# Patient Record
Sex: Female | Born: 1975 | Race: Black or African American | Hispanic: No | Marital: Single | Smoking: Current every day smoker
Health system: Southern US, Community
[De-identification: ages and names within clinical notes are randomized; demographics above are authoritative.]

## PROBLEM LIST (undated history)

## (undated) DIAGNOSIS — F419 Anxiety disorder, unspecified: Secondary | ICD-10-CM

## (undated) DIAGNOSIS — R51 Headache: Secondary | ICD-10-CM

## (undated) DIAGNOSIS — J45909 Unspecified asthma, uncomplicated: Secondary | ICD-10-CM

## (undated) DIAGNOSIS — F32A Depression, unspecified: Secondary | ICD-10-CM

## (undated) DIAGNOSIS — F329 Major depressive disorder, single episode, unspecified: Secondary | ICD-10-CM

---

## 2011-06-27 ENCOUNTER — Encounter (HOSPITAL_COMMUNITY): Payer: Self-pay | Admitting: *Deleted

## 2011-06-27 ENCOUNTER — Inpatient Hospital Stay (HOSPITAL_COMMUNITY)
Admission: RE | Admit: 2011-06-27 | Discharge: 2011-06-29 | DRG: 885 | Disposition: A | Discharge status: Home / Self Care | Payer: Medicaid Other | Attending: Psychiatry | Admitting: Psychiatry

## 2011-06-27 DIAGNOSIS — F339 Major depressive disorder, recurrent, unspecified: Principal | ICD-10-CM

## 2011-06-27 DIAGNOSIS — R45851 Suicidal ideations: Secondary | ICD-10-CM

## 2011-06-27 DIAGNOSIS — F4001 Agoraphobia with panic disorder: Secondary | ICD-10-CM

## 2011-06-27 DIAGNOSIS — F329 Major depressive disorder, single episode, unspecified: Secondary | ICD-10-CM

## 2011-06-27 HISTORY — DX: Depression, unspecified: F32.A

## 2011-06-27 HISTORY — DX: Major depressive disorder, single episode, unspecified: F32.9

## 2011-06-27 HISTORY — DX: Anxiety disorder, unspecified: F41.9

## 2011-06-27 HISTORY — DX: Headache: R51

## 2011-06-27 LAB — DIFFERENTIAL
Basophils Absolute: 0 10*3/uL (ref 0.0–0.1)
Basophils Relative: 0 % (ref 0–1)
Eosinophils Absolute: 0.2 10*3/uL (ref 0.0–0.7)
Monocytes Relative: 6 % (ref 3–12)
Neutro Abs: 6.2 10*3/uL (ref 1.7–7.7)
Neutrophils Relative %: 61 % (ref 43–77)

## 2011-06-27 LAB — COMPREHENSIVE METABOLIC PANEL
AST: 14 U/L (ref 0–37)
Albumin: 3.8 g/dL (ref 3.5–5.2)
Alkaline Phosphatase: 59 U/L (ref 39–117)
BUN: 9 mg/dL (ref 6–23)
Chloride: 101 mEq/L (ref 96–112)
Potassium: 3.7 mEq/L (ref 3.5–5.1)
Sodium: 135 mEq/L (ref 135–145)
Total Bilirubin: 0.2 mg/dL — ABNORMAL LOW (ref 0.3–1.2)
Total Protein: 7.8 g/dL (ref 6.0–8.3)

## 2011-06-27 LAB — CBC
MCH: 25.9 pg — ABNORMAL LOW (ref 26.0–34.0)
MCHC: 32.1 g/dL (ref 30.0–36.0)
Platelets: 311 10*3/uL (ref 150–400)
RDW: 13.8 % (ref 11.5–15.5)

## 2011-06-27 MED ORDER — TRAZODONE HCL 100 MG PO TABS
100.0000 mg | ORAL_TABLET | Freq: Every evening | ORAL | Status: DC | PRN
  Administered 2011-06-27 – 2011-06-28 (×2): 100 mg via ORAL
  Filled 2011-06-27 (×3): qty 1

## 2011-06-27 MED ORDER — MAGNESIUM HYDROXIDE 400 MG/5ML PO SUSP: 30.0000 mL | Freq: Every day | ORAL | Status: DC | PRN

## 2011-06-27 MED ORDER — ACETAMINOPHEN 325 MG PO TABS: 650.0000 mg | ORAL_TABLET | Freq: Four times a day (QID) | ORAL | Status: DC | PRN

## 2011-06-27 MED ORDER — ALBUTEROL SULFATE (5 MG/ML) 0.5% IN NEBU
2.5000 mg | INHALATION_SOLUTION | Freq: Four times a day (QID) | RESPIRATORY_TRACT | Status: DC | PRN
  Administered 2011-06-27: 2.5 mg via RESPIRATORY_TRACT

## 2011-06-27 MED ORDER — MONTELUKAST SODIUM 10 MG PO TABS
10.0000 mg | ORAL_TABLET | Freq: Every day | ORAL | Status: DC
  Administered 2011-06-27 – 2011-06-28 (×2): 10 mg via ORAL
  Filled 2011-06-27 (×3): qty 1

## 2011-06-27 MED ORDER — FLUTICASONE PROPIONATE 50 MCG/ACT NA SUSP
2.0000 | Freq: Two times a day (BID) | NASAL | Status: DC | PRN
  Administered 2011-06-28 – 2011-06-29 (×2): 2 via NASAL
  Filled 2011-06-27: qty 16

## 2011-06-27 MED ORDER — ALUM & MAG HYDROXIDE-SIMETH 200-200-20 MG/5ML PO SUSP: 30.0000 mL | ORAL | Status: DC | PRN

## 2011-06-27 NOTE — Progress Notes (Signed)
  Admit Note: Patient voluntarily admitted to bed 502-2. Patient was directed to [facility] after initial meeting with outpatient therapist. Patient admits to feelings of hopelessness, anhedonia and anxiety with passive SI. Patient states that she has Bipolar and self medicates with THC daily. Currently not on psych medications. Reports using inhalers for asthma. Patient  Reports no PCP. Patient requesting help with medications and coping skills.
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
## 2011-06-28 DIAGNOSIS — F329 Major depressive disorder, single episode, unspecified: Secondary | ICD-10-CM | POA: Diagnosis present

## 2011-06-28 DIAGNOSIS — F339 Major depressive disorder, recurrent, unspecified: Secondary | ICD-10-CM

## 2011-06-28 LAB — CBC
Hemoglobin: 12.1 g/dL (ref 12.0–15.0)
MCV: 81 fL (ref 78.0–100.0)
Platelets: 307 10*3/uL (ref 150–400)
RBC: 4.63 MIL/uL (ref 3.87–5.11)
WBC: 10.5 10*3/uL (ref 4.0–10.5)

## 2011-06-28 LAB — COMPREHENSIVE METABOLIC PANEL
ALT: 15 U/L (ref 0–35)
Alkaline Phosphatase: 63 U/L (ref 39–117)
BUN: 11 mg/dL (ref 6–23)
CO2: 26 mEq/L (ref 19–32)
GFR calc Af Amer: >90 mL/min (ref >90)
GFR calc non Af Amer: >90 mL/min (ref >90)
Glucose, Bld: 102 mg/dL — ABNORMAL HIGH (ref 70–99)
Potassium: 4.1 mEq/L (ref 3.5–5.1)
Sodium: 138 mEq/L (ref 135–145)
Total Bilirubin: 0.2 mg/dL — ABNORMAL LOW (ref 0.3–1.2)

## 2011-06-28 LAB — T4, FREE: Free T4: 1.43 ng/dL (ref 0.80–1.80)

## 2011-06-28 LAB — DIFFERENTIAL
Eosinophils Relative: 2 % (ref 0–5)
Lymphocytes Relative: 27 % (ref 12–46)
Lymphs Abs: 2.9 10*3/uL (ref 0.7–4.0)
Monocytes Relative: 6 % (ref 3–12)
Neutrophils Relative %: 66 % (ref 43–77)

## 2011-06-28 LAB — URINE MICROSCOPIC-ADD ON

## 2011-06-28 LAB — URINALYSIS, ROUTINE W REFLEX MICROSCOPIC
Bilirubin Urine: NEGATIVE
Hgb urine dipstick: NEGATIVE
Ketones, ur: NEGATIVE mg/dL
Protein, ur: NEGATIVE mg/dL
Urobilinogen, UA: 0.2 mg/dL (ref 0.0–1.0)

## 2011-06-28 LAB — T3, FREE: T3, Free: 3.1 pg/mL (ref 2.3–4.2)

## 2011-06-28 LAB — TSH: TSH: 1.143 u[IU]/mL (ref 0.350–4.500)

## 2011-06-28 MED ORDER — ALBUTEROL SULFATE HFA 108 (90 BASE) MCG/ACT IN AERS
2.0000 | INHALATION_SPRAY | RESPIRATORY_TRACT | Status: DC | PRN
  Administered 2011-06-28 (×2): 2 via RESPIRATORY_TRACT
  Filled 2011-06-28 (×2): qty 6.7

## 2011-06-28 MED ORDER — SERTRALINE HCL 50 MG PO TABS
50.0000 mg | ORAL_TABLET | Freq: Every day | ORAL | Status: DC
  Administered 2011-06-28 – 2011-06-29 (×2): 50 mg via ORAL
  Filled 2011-06-28: qty 7
  Filled 2011-06-28 (×3): qty 1

## 2011-06-28 MED ORDER — FLUTICASONE PROPIONATE HFA 220 MCG/ACT IN AERO
2.0000 | INHALATION_SPRAY | Freq: Two times a day (BID) | RESPIRATORY_TRACT | Status: DC
  Administered 2011-06-28 – 2011-06-29 (×2): 2 via RESPIRATORY_TRACT
  Filled 2011-06-28: qty 12

## 2011-06-28 NOTE — Progress Notes (Signed)
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  Panic Disorder with Agoraphobia.  The patient was seen today and reports the following:   ADL's: Intact  Sleep: Sleeping well without difficulty.  Appetite: Yes, AEB: Good Appetite.   Mild>(1-10) >Severe  Hopelessness (1-10): 5  Depression (1-10): 6  Anxiety (1-10): 3   Suicidal Ideation: Adamantly denies any suicidal ideations.  Plan: No  Intent: No  Means: No  Homicidal Ideation: Adamantly denies any Homicidal Ideations.  Plan: No  Intent: No  Means: No  Mental Status: AO x 3  General Appearance /Behavior: Neat and Casual  Eye Contact: Good  Motor Behavior: Normal  Speech: Normal  Level of Consciousness: Alert  Mood: Euthymic  Affect: Bright and Full  Anxiety Mild.  Thought Process: Coherent  Thought Content: WNL with no auditory or visual hallucinations or delusional thinking.  Perception: Normal  Judgment: Fair  Insight: Good.  Cognition: Orientation time, place and person   Treatment Plan Summary:  1. Daily contact with patient to assess and evaluate symptoms and progress in treatment  2. Medication management  3. The patient will deny suicidal ideations for 48 hours prior to discharge and have a depression and anxiety rating of 3 or less.   Plan:  1. Continue current medications.  2. Will start Zoloft at 50 mgs po qam for reported depression and panic symptoms.  3. Continue to monitor. 4. Possible  discharge tomorrow.  SUICIDE RISK:   Minimal: No identifiable suicidal ideation.  Patients presenting with no risk factors but with morbid ruminations; may be classified as minimal risk based on the severity of the depressive symptoms   Kara Hunter 06/28/2011, 2:29 PM

## 2011-06-28 NOTE — Progress Notes (Signed)
Pt wants to get home to her daughter.  She wants help with her depression while she is she and is looking forward to starting antidepressant zoloft today.   She rates her depression a 7  And hoplessness.  7.  She is attending groups, interacting with others and patiently waiting for he meds.

## 2011-06-28 NOTE — Progress Notes (Signed)
[facility] Adult Inpatient Family/Significant Other Suicide Prevention Education  Suicide Prevention Education:  Education Completed; Derrek Monaco, cousin,  (name of family member/significant other) has been identified by the patient as the family member/significant other with whom the patient will be residing, and identified as the person(s) who will aid the patient in the event of a mental health crisis (suicidal ideations/suicide attempt).  With written consent from the patient, the family member/significant other has been provided the following suicide prevention education, prior to the and/or following the discharge of the patient.  The suicide prevention education provided includes the following:  Suicide risk factors  Suicide prevention and interventions  National Suicide Hotline telephone number  [facility] assessment telephone number  [facility] Emergency Assistance 911  [facility] and/or Residential Mobile Crisis Unit telephone number  Request made of family/significant other to:  Remove weapons (e.g., guns, rifles, knives), all items previously/currently identified as safety concern.    Remove drugs/medications (over-the-counter, prescriptions, illicit drugs), all items previously/currently identified as a safety concern.  The family member/significant other verbalizes understanding of the suicide prevention education information provided.  The family member/significant other agrees to remove the items of safety concern listed above.  * Mykaela's cousin does not believe that she is a danger to herself or others if discharged. She reports that Darnetta does not have any access to weapons and that she will be home with [place] watching after her. She verbalized understanding of suicide prevention information and had no questions.   [name] 06/28/2011, 3:12 PM

## 2011-06-28 NOTE — Progress Notes (Signed)
Pt reported high levels of anxiety and depression both at a 7. Her Si was 0. Pt sees a therapist at [facility] (just started when she was sent over here). Pt reports getting her diagnosis from [facility] where she attends groups for SA and other things. Pt is currently living with her cousin and had been since July and does have access to transportation.  She does utilize [employer]. Pt has recently applied for disability citing her asthma and bipolar disorder. Pt was given suicide prevention and safety planning information. Pt was able to come up with ways to help herself stay out of the depressed state and cited doing more things with her daughter. Jeannette How 06/28/2011 11:40 AM

## 2011-06-28 NOTE — Progress Notes (Signed)
Recreation Therapy Group Note  Date: 06/28/2011         Time: 1415      Group Topic/Focus: The focus of this group is on enhancing patients' ability to work cooperatively with others. Groups discusses barriers to cooperation and strategies for successful cooperation.   Participation Level: Minimal  Participation Quality: Resistant  Affect: Irritable  Cognitive: Oriented   Additional Comments: Patient abrasive, disrespectful to staff, complaining about group. Patient became verbally aggressive towards a female peer when he made a suggestion during the intervention, walked in and out of the room several times when she became frustrated.   Kara Hunter 06/28/2011 3:55 PM

## 2011-06-28 NOTE — Tx Team (Signed)
Interdisciplinary Treatment Plan Update (Adult)  Date:  06/29/11 Time Reviewed:  9:30 am  Progress in Treatment: Attending groups: Yes. Participating in groups:  Yes. Taking medication as prescribed:  Yes. Tolerating medication:  Yes. Family/Significant othe contact made:  No, will contact:  contact made with cousin Patient understands diagnosis:  Yes. Discussing patient identified problems/goals with staff:  Yes. Medical problems stabilized or resolved:  Yes. Denies suicidal/homicidal ideation: Yes. Issues/concerns per patient self-inventory:  No. Other:  New problem(s) identified: No, Describe:  none identified  Reason for Continuation of Hospitalization: Pt stable for d/c today  Interventions implemented related to continuation of hospitalization:   Additional comments:  Estimated length of stay:  D/C today Discharge Plan: pt will f/[facility] and therapy  New goal(s):  Review of initial/current patient goals per problem list:   1.  Goal(s): Depression  Met:  Yes  Target date: by d/c  As evidenced by: pt reduced depression from a 10 to a 3.   2.  Goal (s): Suicidal Ideation  Met:  Yes  Target date: by d/c  As evidenced by: pt denies SI.   3.  Goal(s):  Met:  No  Target date:  As evidenced by:  4.  Goal(s):  Met:  No  Target date:  As evidenced by:  Attendees: Patient:     Family:     Physician:  Franchot Gallo, MD 06/29/11 10:30 am  Nursing:   Neill Loft, RN 06/29/11 10:30 am  Case Manager:  Reyes Ivan, LCSW 06/29/11 10:30 am  Counselor:  Angus Palms, LCSW 06/29/11 10:30 am  Other:  Jeannette How, LCSW 06/29/11 10:30 am  Other:  Harl Favor, intern 06/29/11 10:30 am  Other:  Cato Mulligan, RN 06/29/11 10:30 am  Other:      Scribe for Treatment Team:   Carmina Miller

## 2011-06-28 NOTE — Progress Notes (Signed)
  Pt stated that today was her first appt with her therapist. Stated that the therapist asked her if she was depressed or having thoughts of harming herself. Stated she replied "yes". Therapist had to her come to [facility], without letting her go home. "I have a baby at home", referring to her 61yr child. Pt stated she's glad she(pt) lives with her cousin. Stated at least her child will be taken care of. Support and encouragement were offered.

## 2011-06-28 NOTE — Progress Notes (Signed)
[facility] Group Notes:  (Counselor/Nursing/MHT/Case Management/Adjunct)  06/28/2011 1:28 PM  Type of Therapy:  Group Therapy  Participation Level:  None  Participation Quality:  Inattentive and Drowsy  Affect:  Flat  Cognitive:  Asleep  Insight:  None  Engagement in Group:  None  Engagement in Therapy:  None  Modes of Intervention:  Support and Exploration  Summary of Progress/Problems: Brodi slept through group. She was awakened several times and counselor attempted to engage her, but she kept falling back asleep, then laughing about it when she was awakened.    [name] 06/28/2011, 1:28 PM

## 2011-06-28 NOTE — Progress Notes (Signed)
  Job number for dictation is [id number]. This is Landry Corporal, np dicatating a psychiatric admission note. Thank you

## 2011-06-28 NOTE — Plan of Care (Signed)
Problem: Alteration in mood Goal: LTG-Patient reports reduction in suicidal thoughts (Patient reports reduction in suicidal thoughts and is able to verbalize a safety plan for whenever patient is feeling suicidal)  Outcome: Not Progressing This is pt's first day Goal: LTG-Pt's behavior demonstrates decreased signs of depression (Patient's behavior demonstrates decreased signs of depression to the point the patient is safe to return home and continue treatment in an outpatient setting)  Outcome: Progressing This is the pt's first day. Goal: STG-Patient is compliant with medication regimen Outcome: Progressing Pay was compliant in taking hs meds Goal: STG-Patient reports thoughts of self-harm to staff Outcome: Not Applicable Date Met:  06/28/11 Denies at this time  Problem: Consults Goal: Suicide Risk Patient Education (See Patient Education module for education specifics)  Outcome: Progressing Pt's first day  Problem: Diagnosis: Increased Risk For Suicide Attempt Goal: LTG-Patient Will Show Positive Response to Medication LTG (by discharge) : Patient will show positive response to medication and will participate in the development of the discharge plan.  Outcome: Progressing Pt took prn meds Goal: LTG-Patient Will Report Improved Mood and Deny Suicidal LTG (by discharge) Patient will report improved mood and deny suicidal ideation.  Outcome: Progressing Initiated for pt's first day  Problem: Spiritual Needs Goal: Ability to function at adequate level Outcome: Progressing Initiated

## 2011-06-29 LAB — TSH: TSH: 0.863 u[IU]/mL (ref 0.350–4.500)

## 2011-06-29 MED ORDER — SERTRALINE HCL 50 MG PO TABS: 50.0000 mg | ORAL_TABLET | Freq: Every day | ORAL | Status: AC

## 2011-06-29 MED ORDER — TRAZODONE HCL 100 MG PO TABS: 100.0000 mg | ORAL_TABLET | Freq: Every evening | ORAL | Status: AC | PRN

## 2011-06-29 MED ORDER — INFLUENZA VIRUS VACC SPLIT PF IM SUSP
0.5000 mL | INTRAMUSCULAR | Status: AC
  Administered 2011-06-29: 0.5 mL via INTRAMUSCULAR

## 2011-06-29 NOTE — Plan of Care (Signed)
Problem: Alteration in mood Goal: STG-Patient is able to discuss feelings and issues Process the role of emotions and logic in Kara Hunter's experience of depression. Outcome: Adequate for Discharge Kara Hunter reports that she is an emotional person, and that she can use logic when she needs to make decisions. However, she was unwilling or unable to further examine ways to use wisemind techniques to combat depression.

## 2011-06-29 NOTE — Discharge Summary (Signed)
NAME:  DIA, DONATE NO.:  [id number]  MEDICAL RECORD NO.:  [id number]  LOCATION:  0502                          FACILITY:  [facility]  PHYSICIAN:  Franchot Gallo, MD     DATE OF BIRTH:  1975-11-22  DATE OF ADMISSION:  06/27/2011 DATE OF DISCHARGE:  06/29/2011                              DISCHARGE SUMMARY   REASON FOR ADMISSION:  The patient was here for assessment after she had seen her therapist for the first time and the patient was reporting some suicidal thinking.  The patient denied any suicide attempt and did have a deterrent for self-harm, which was her children.  PAST PSYCHIATRIC HISTORY:  First admission to [facility]. Currently in outpatient therapy.  Has never been on any psychotropic medications.  PERTINENT LABORATORIES:  Her CMP was within normal limits.  Her TSH was 0.863.  Urinalysis showed 3-6 WBCs.  Blood pressure was 117/76.  SIGNIFICANT FINDINGS:  The patient was admitted to the adult milieu for safety and stabilization.  We started the patient on Zoloft.  She was having no medication side effects.  She was denying any suicidal or homicidal thoughts.  She slept well on her medications.  Appetite was good.  Denied any psychotic symptoms.  On the day of discharge, the patient was fully alert and cooperative.  Sleep was good.  Appetite was good.  Having no suicidal or homicidal thoughts or psychotic symptoms. She was having no medication side effects to her Zoloft or trazodone and was considered stable for discharge.  Her followup appointment was with [facility] on July 04, 2011.  Her discharge medications were Zoloft 50 mg 1 daily, trazodone 100 mg q.h.s. for sleep.  The patient was to continue taking her albuterol treatments, her Flonase nasal spray as needed, Singulair 10 mg daily, and triamcinolone cream topically t.i.d. as needed for eczema.     Landry Corporal, N.P.   ______________________________ Franchot Gallo, MD    JO/MEDQ  D:  06/29/2011  T:  06/29/2011  Job:  [id number]

## 2011-06-29 NOTE — Progress Notes (Signed)
[name] attended groups on using wisemind to balance depressive thoughts and logical thoughts, but did not participate fully. She was able to discuss ways that she uses emotion and reason mind, but not able/willing to talk about how she can move toward wisemind when feeling depressed. Kara Hunter is aware of factors that place her at a heightened suicide risk, and suicide prevention education was done with her cousin, Karel Jarvis, who had no safety concerns.

## 2011-06-29 NOTE — Assessment & Plan Note (Signed)
NAME:  Kara Hunter, Kara Hunter NO.:  [id number]  MEDICAL RECORD NO.:  [id number]  LOCATION:  0502                          FACILITY:  [facility]  PHYSICIAN:  Franchot Gallo, MD     DATE OF BIRTH:  1975/12/21  DATE OF ADMISSION:  06/27/2011 DATE OF DISCHARGE:                      PSYCHIATRIC ADMISSION ASSESSMENT   HISTORY OF PRESENT ILLNESS:  Patient is here after she was sent by her therapist for expressing some suicidal thoughts.  This is her first visit with this therapist, the patient was reporting her symptoms of depression, having a suicidal plan to cut her wrist but stating that she would never harm herself, reporting her to deterrent from self-harm would be her children.  Again she was encouraged to come to Behavior Health for further assessment of her suicidal thinking.  The patient does report feeling depressed for the past several weeks, has never been on any medications.  She denies any substance use.  Denies any psychotic symptoms.  Denies any specific stressors.  The patient was reporting problems with sleep for the past 6-7 months and recently has had an 8 pounds weight loss.  PAST PSYCHIATRIC HISTORY:  First admission to [facility], has never been on any antidepressants, has never had any other hospitalizations.  SOCIAL HISTORY:  This is a 35 year old female, single, has 2 children ages 46 and 28, she lives in [place] with a cousin.  She is unemployed, has no legal troubles.  MEDICAL PROBLEMS:  History of asthma is currently on inhalers and Singulair.  PRIMARY CARE PROVIDERS:  None.  MEDICATIONS:  The patient again lists Singulair 10 mg daily, Flonase nasal spray and albuterol inhaler.  DRUG ALLERGIES: PENICILLIN, problems with anaphylaxis.  REVIEW OF SYSTEMS:  GENERAL:  The patient reports problems with insomnia, again problems with appetite changes with an 8 pounds weight loss.  CARDIORESPIRATORY:  Denies any chest pain, does get short  of breath with her asthma attacks.  GASTROINTESTINAL:  No problems with nausea, vomiting, diarrhea or constipation.  No dysuria.  History of dry skin.  No other lacerations or rashes.  No dysuria, hematuria.  No muscle pains.  No headaches, seizures and positive for depression with suicidal thinking.  LABORATORY DATA:  Shows a TSH of 1.143, her CMP is within normal limits.  MENTAL STATUS EXAM:  Patient is fully alert and cooperative.  She is dressed in her own clothing.  Good eye contact, very bubbly.  She has a good sense of humor.  Thought processes are coherent, goal directed.  No evidence of any psychotic symptoms.  Denies any suicidal or homicidal thoughts.  Judgment and insight appear to be good.  Her memory is intact.  DIAGNOSES:  Axis I:  Major depressive disorder. Axis II:  Deferred. Axis III:  History of asthma and eczema. Axis IV:  Deferred at this time. Axis V:  Current is 45.  PLAN:  Is to initiate Zoloft for her depressive symptoms.  Patient is to continue with her therapy.  We will contact her support group for safety issues.  Her tentative length of stay is 2-3 days.     Landry Corporal, N.P.   ______________________________ Franchot Gallo, MD    JO/MEDQ  D:  06/28/2011  T:  06/29/2011  Job:  [id number]

## 2011-06-29 NOTE — Discharge Summary (Addendum)
  This is job number for a discharge summary [id number]. This is Landry Corporal, NP dictating this note for Dr. Allena Katz. Thank you

## 2011-06-29 NOTE — Discharge Summary (Signed)
Pt attended discharge planning group and actively participated.  Pt presents with bright affect and mood.  Pt reports feeling stable to d/c today.  Pt ranks depression and anxiety at a 3 and denies SI.  Pt will follow up with [facility] for medication management and therapy; appointment scheduled by SW.  Pt will return home with children and will utilize [facility] for transportation back home.  SW will arrange this.  Met pt's goals by ensuring pt has follow up and discussing safety planning and suicide prevention with pt.  Pt was able to identify her warning signs and who to call if feeling depressed and/or suicidal again.  No further d/c needs voiced.    Reyes Ivan, LCSWA 06/29/2011  9:23 AM

## 2011-06-29 NOTE — Progress Notes (Signed)
  2222 Pt was in the hallway at the time of this assessment.  Pt was appropriate during assessment and showed humor as well. Pt denies any SI/HI,AVH,or pain. Pt denies any feelings of anxiety or depression, but was given Trazodone 100 mg for insomnia. Support was given, Q15 checks continue, pt remains safe on the unit.

## 2011-06-29 NOTE — Progress Notes (Signed)
  Discharge Note:  Pt was discharged ambulatory to ride home with [facility].  She denies SI/HI  She verbalizes understanding of discharge meds and followup.  She was given a suicide prevention pamphlet and she is eager to see her daughter.  She says she plans to try to stop smoking .

## 2011-06-29 NOTE — Progress Notes (Signed)
[facility] Group Notes:  (Counselor/Nursing/MHT/Case Management/Adjunct)  06/29/2011 1:52 PM  Type of Therapy:  Psychoeducational Skills  Participation Level:  None  Participation Quality:  Drowsy  Affect:  Labile  Cognitive: ?  Insight:  None  Engagement in Group:  None  Engagement in Therapy:  None  Modes of Intervention:  Education  Summary of Progress/Problems: Kara Hunter appeared to be sleep or drowsy throughout the entire group session. She did sit up periodically to look around but her actual participation was very little to none. She was not open to share her experience related to wise mind the involved components (reason and emotion)  Quincy Sheehan 06/29/2011, 1:52 PM
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
## 2011-06-29 NOTE — Progress Notes (Signed)
Suicide Risk Assessment  Discharge Assessment     Demographic factors:  Assessment Details Time of Assessment: Discharge Information Obtained From: Patient Current Mental Status:  Current Mental Status:  (denies SI) Risk Reduction Factors:  Risk Reduction Factors: Positive social support;Sense of responsibility to family;Responsible for children under 36 years of age;Positive therapeutic relationship  CLINICAL FACTORS:   Severe Anxiety and/or Agitation Panic Attacks Depression:   Anhedonia Insomnia More than one psychiatric diagnosis Previous Psychiatric Diagnoses and Treatments  Diagnosis:  Axis I: Major Depressive Disorder - Recurrent.  Panic Disorder with Agoraphobia.   The patient was seen today and reports the following:   ADL's: Intact  Sleep: Sleeping well without difficulty.  Appetite: Yes, AEB: Good Appetite.  Mild>(1-10) >Severe  Hopelessness (1-10): 0  Depression (1-10): 3-4  Anxiety (1-10): 2-3   Suicidal Ideation: Adamantly denies any suicidal ideations.  Plan: No  Intent: No  Means: No  Homicidal Ideation: Adamantly denies any Homicidal Ideations.  Plan: No  Intent: No  Means: No  Mental Status: AO x 3 General Appearance /Behavior: Neat and Casual  Eye Contact: Good  Motor Behavior: Normal  Speech: Normal  Level of Consciousness: Alert  Mood: Mildly Depressed.  Affect: Bright and Full  Anxiety Mild.  Thought Process: Coherent  Thought Content: WNL with no auditory or visual hallucinations or delusional thinking.  Perception: Normal  Judgment: Good  Insight: Good.  Cognition: Orientation time, place and person   Treatment Plan Summary:  1. Daily contact with patient to assess and evaluate symptoms and progress in treatment  2. Medication management  3. The patient will deny suicidal ideations for 48 hours prior to discharge and have a depression and anxiety rating of 3 or less.  Plan:   1. Continue current medications.  2. The patient will  be discharged today as requested to outpatient follow-up as outlined on her discharge instructions. 3.  Discharge home today.   Minimal: No identifiable suicidal ideation.  Patients presenting with no risk factors but with morbid ruminations; may be classified as minimal risk based on the severity of the depressive symptoms  PLAN OF CARE:  Kara Hunter 06/29/2011, 1:39 PM

## 2011-07-03 NOTE — Progress Notes (Signed)
Patient Discharge Instructions:  D/C instructions faxed, Date faxed:  06/30/11 D/C Summary faxed, Date faxed:  06/30/11 Med. Rec. Form faxed, Date faxed:  06/30/11  Lolita Cram nita, 07/03/2011, 10:52 AM

## 2012-01-22 ENCOUNTER — Emergency Department (HOSPITAL_BASED_OUTPATIENT_CLINIC_OR_DEPARTMENT_OTHER)
Admission: EM | Admit: 2012-01-22 | Discharge: 2012-01-22 | Disposition: A | Discharge status: Home / Self Care | Payer: Medicaid Other | Attending: Emergency Medicine | Admitting: Emergency Medicine

## 2012-01-22 ENCOUNTER — Encounter (HOSPITAL_BASED_OUTPATIENT_CLINIC_OR_DEPARTMENT_OTHER): Payer: Self-pay | Admitting: *Deleted

## 2012-01-22 DIAGNOSIS — H05231 Hemorrhage of right orbit: Secondary | ICD-10-CM

## 2012-01-22 DIAGNOSIS — S0083XA Contusion of other part of head, initial encounter: Secondary | ICD-10-CM

## 2012-01-22 DIAGNOSIS — Y9229 Other specified public building as the place of occurrence of the external cause: Secondary | ICD-10-CM | POA: Insufficient documentation

## 2012-01-22 DIAGNOSIS — S0010XA Contusion of unspecified eyelid and periocular area, initial encounter: Secondary | ICD-10-CM | POA: Insufficient documentation

## 2012-01-22 DIAGNOSIS — Y990 Civilian activity done for income or pay: Secondary | ICD-10-CM | POA: Insufficient documentation

## 2012-01-22 MED ORDER — HYDROCODONE-ACETAMINOPHEN 5-325 MG PO TABS
1.0000 | ORAL_TABLET | Freq: Once | ORAL | Status: AC
  Administered 2012-01-22: 1 via ORAL
  Filled 2012-01-22: qty 1

## 2012-01-22 MED ORDER — HYDROCODONE-ACETAMINOPHEN 5-325 MG PO TABS: ORAL_TABLET | ORAL | Status: AC
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
## 2012-01-22 NOTE — ED Notes (Signed)
Ice pack given for home use- rx x 1 given for hydrocodone- pt has a ride

## 2012-01-22 NOTE — ED Notes (Signed)
Ghim MD at bedside. 
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
## 2016-04-11 ENCOUNTER — Emergency Department (HOSPITAL_COMMUNITY): Payer: Medicaid Other

## 2016-04-11 ENCOUNTER — Emergency Department (HOSPITAL_COMMUNITY): Payer: Medicaid Other | Admitting: Anesthesiology

## 2016-04-11 ENCOUNTER — Encounter (HOSPITAL_COMMUNITY): Admission: EM | Disposition: A | Discharge status: Home Health | Payer: Self-pay | Source: Home / Self Care

## 2016-04-11 ENCOUNTER — Encounter (HOSPITAL_COMMUNITY): Payer: Self-pay | Admitting: Emergency Medicine

## 2016-04-11 ENCOUNTER — Inpatient Hospital Stay (HOSPITAL_COMMUNITY)
Admission: EM | Admit: 2016-04-11 | Discharge: 2016-04-18 | DRG: 329 | Disposition: A | Discharge status: Home Health | Payer: Medicaid Other | Attending: Surgery | Admitting: Surgery

## 2016-04-11 DIAGNOSIS — R0989 Other specified symptoms and signs involving the circulatory and respiratory systems: Secondary | ICD-10-CM

## 2016-04-11 DIAGNOSIS — Z01818 Encounter for other preprocedural examination: Secondary | ICD-10-CM

## 2016-04-11 DIAGNOSIS — D62 Acute posthemorrhagic anemia: Secondary | ICD-10-CM | POA: Diagnosis not present

## 2016-04-11 DIAGNOSIS — W3400XA Accidental discharge from unspecified firearms or gun, initial encounter: Secondary | ICD-10-CM

## 2016-04-11 DIAGNOSIS — Z978 Presence of other specified devices: Secondary | ICD-10-CM

## 2016-04-11 DIAGNOSIS — J9690 Respiratory failure, unspecified, unspecified whether with hypoxia or hypercapnia: Secondary | ICD-10-CM | POA: Diagnosis not present

## 2016-04-11 DIAGNOSIS — S36409A Unspecified injury of unspecified part of small intestine, initial encounter: Secondary | ICD-10-CM | POA: Diagnosis present

## 2016-04-11 DIAGNOSIS — I959 Hypotension, unspecified: Secondary | ICD-10-CM | POA: Diagnosis present

## 2016-04-11 DIAGNOSIS — S31139A Puncture wound of abdominal wall without foreign body, unspecified quadrant without penetration into peritoneal cavity, initial encounter: Secondary | ICD-10-CM

## 2016-04-11 DIAGNOSIS — S36509A Unspecified injury of unspecified part of colon, initial encounter: Secondary | ICD-10-CM | POA: Diagnosis present

## 2016-04-11 DIAGNOSIS — F17210 Nicotine dependence, cigarettes, uncomplicated: Secondary | ICD-10-CM | POA: Diagnosis present

## 2016-04-11 DIAGNOSIS — S36598A Other injury of other part of colon, initial encounter: Secondary | ICD-10-CM | POA: Diagnosis present

## 2016-04-11 DIAGNOSIS — J45909 Unspecified asthma, uncomplicated: Secondary | ICD-10-CM | POA: Diagnosis present

## 2016-04-11 DIAGNOSIS — S31640A Puncture wound with foreign body of abdominal wall, right upper quadrant with penetration into peritoneal cavity, initial encounter: Principal | ICD-10-CM | POA: Diagnosis present

## 2016-04-11 DIAGNOSIS — K659 Peritonitis, unspecified: Secondary | ICD-10-CM | POA: Diagnosis present

## 2016-04-11 HISTORY — PX: BOWEL RESECTION: SHX1257

## 2016-04-11 HISTORY — DX: Unspecified asthma, uncomplicated: J45.909

## 2016-04-11 HISTORY — PX: APPLICATION OF WOUND VAC: SHX5189

## 2016-04-11 HISTORY — PX: LAPAROTOMY: SHX154

## 2016-04-11 LAB — I-STAT CHEM 8, ED
BUN: 11 mg/dL (ref 6–20)
CALCIUM ION: 1.12 mmol/L — AB (ref 1.13–1.30)
CREATININE: 0.9 mg/dL (ref 0.44–1.00)
Chloride: 106 mmol/L (ref 101–111)
GLUCOSE: 179 mg/dL — AB (ref 65–99)
HCT: 37 % (ref 36.0–46.0)
Hemoglobin: 12.6 g/dL (ref 12.0–15.0)
Potassium: 2.9 mmol/L — ABNORMAL LOW (ref 3.5–5.1)
Sodium: 141 mmol/L (ref 135–145)
TCO2: 24 mmol/L (ref 0–100)

## 2016-04-11 LAB — COMPREHENSIVE METABOLIC PANEL
ALT: 27 U/L (ref 14–54)
ANION GAP: 8 (ref 5–15)
AST: 29 U/L (ref 15–41)
Albumin: 3.7 g/dL (ref 3.5–5.0)
Alkaline Phosphatase: 46 U/L (ref 38–126)
BILIRUBIN TOTAL: 0.5 mg/dL (ref 0.3–1.2)
BUN: 11 mg/dL (ref 6–20)
CO2: 20 mmol/L — ABNORMAL LOW (ref 22–32)
Calcium: 8.7 mg/dL — ABNORMAL LOW (ref 8.9–10.3)
Chloride: 108 mmol/L (ref 101–111)
Creatinine, Ser: 0.92 mg/dL (ref 0.44–1.00)
Glucose, Bld: 181 mg/dL — ABNORMAL HIGH (ref 65–99)
POTASSIUM: 2.8 mmol/L — AB (ref 3.5–5.1)
Sodium: 136 mmol/L (ref 135–145)
TOTAL PROTEIN: 6.6 g/dL (ref 6.5–8.1)

## 2016-04-11 LAB — CBC
HEMATOCRIT: 36.8 % (ref 36.0–46.0)
Hemoglobin: 11.5 g/dL — ABNORMAL LOW (ref 12.0–15.0)
MCH: 25.3 pg — ABNORMAL LOW (ref 26.0–34.0)
MCHC: 31.3 g/dL (ref 30.0–36.0)
MCV: 80.9 fL (ref 78.0–100.0)
PLATELETS: 317 10*3/uL (ref 150–400)
RBC: 4.55 MIL/uL (ref 3.87–5.11)
RDW: 13.7 % (ref 11.5–15.5)
WBC: 17.6 10*3/uL — AB (ref 4.0–10.5)

## 2016-04-11 LAB — I-STAT CG4 LACTIC ACID, ED: Lactic Acid, Venous: 2.45 mmol/L (ref 0.5–1.9)

## 2016-04-11 LAB — ABO/RH: ABO/RH(D): B POS

## 2016-04-11 SURGERY — LAPAROTOMY, EXPLORATORY
Anesthesia: General | Site: Abdomen

## 2016-04-11 MED ORDER — CEFAZOLIN SODIUM-DEXTROSE 2-3 GM-% IV SOLR
INTRAVENOUS | Status: DC | PRN
  Administered 2016-04-11: 2 g via INTRAVENOUS

## 2016-04-11 MED ORDER — LIDOCAINE 2% (20 MG/ML) 5 ML SYRINGE
INTRAMUSCULAR | Status: AC
  Filled 2016-04-11: qty 5

## 2016-04-11 MED ORDER — ONDANSETRON HCL 4 MG/2ML IJ SOLN
INTRAMUSCULAR | Status: AC
  Filled 2016-04-11: qty 2

## 2016-04-11 MED ORDER — PROPOFOL 10 MG/ML IV BOLUS
INTRAVENOUS | Status: DC | PRN
  Administered 2016-04-11: 100 mg via INTRAVENOUS

## 2016-04-11 MED ORDER — MIDAZOLAM HCL 5 MG/5ML IJ SOLN
INTRAMUSCULAR | Status: DC | PRN
  Administered 2016-04-11 – 2016-04-12 (×2): 2 mg via INTRAVENOUS

## 2016-04-11 MED ORDER — SUFENTANIL CITRATE 50 MCG/ML IV SOLN
INTRAVENOUS | Status: DC | PRN
  Administered 2016-04-11 – 2016-04-12 (×2): 10 ug via INTRAVENOUS
  Administered 2016-04-12: 20 ug via INTRAVENOUS
  Administered 2016-04-12: 10 ug via INTRAVENOUS

## 2016-04-11 MED ORDER — LIDOCAINE HCL (CARDIAC) 20 MG/ML IV SOLN
INTRAVENOUS | Status: DC | PRN
  Administered 2016-04-11: 100 mg via INTRAVENOUS

## 2016-04-11 MED ORDER — SUCCINYLCHOLINE CHLORIDE 200 MG/10ML IV SOSY
PREFILLED_SYRINGE | INTRAVENOUS | Status: AC
  Filled 2016-04-11: qty 10

## 2016-04-11 MED ORDER — SUGAMMADEX SODIUM 200 MG/2ML IV SOLN
INTRAVENOUS | Status: AC
  Filled 2016-04-11: qty 2

## 2016-04-11 MED ORDER — PHENYLEPHRINE 40 MCG/ML (10ML) SYRINGE FOR IV PUSH (FOR BLOOD PRESSURE SUPPORT)
PREFILLED_SYRINGE | INTRAVENOUS | Status: AC
  Filled 2016-04-11: qty 10

## 2016-04-11 MED ORDER — PHENYLEPHRINE HCL 10 MG/ML IJ SOLN
INTRAMUSCULAR | Status: DC | PRN
  Administered 2016-04-11: 80 ug via INTRAVENOUS

## 2016-04-11 MED ORDER — ROCURONIUM BROMIDE 100 MG/10ML IV SOLN
INTRAVENOUS | Status: DC | PRN
  Administered 2016-04-11 – 2016-04-12 (×2): 50 mg via INTRAVENOUS

## 2016-04-11 MED ORDER — ROCURONIUM BROMIDE 10 MG/ML (PF) SYRINGE
PREFILLED_SYRINGE | INTRAVENOUS | Status: AC
  Filled 2016-04-11: qty 10

## 2016-04-11 MED ORDER — LACTATED RINGERS IV SOLN
INTRAVENOUS | Status: DC | PRN
  Administered 2016-04-11 – 2016-04-12 (×3): via INTRAVENOUS

## 2016-04-11 MED ORDER — SUCCINYLCHOLINE CHLORIDE 20 MG/ML IJ SOLN
INTRAMUSCULAR | Status: DC | PRN
  Administered 2016-04-11: 140 mg via INTRAVENOUS

## 2016-04-11 SURGICAL SUPPLY — 47 items
BLADE SURG ROTATE 9660 (MISCELLANEOUS) IMPLANT
CANISTER SUCTION 2500CC (MISCELLANEOUS) ×4 IMPLANT
CANISTER WOUND CARE 500ML ATS (WOUND CARE) ×4 IMPLANT
CHLORAPREP W/TINT 26ML (MISCELLANEOUS) ×4 IMPLANT
COVER SURGICAL LIGHT HANDLE (MISCELLANEOUS) ×4 IMPLANT
DRAPE LAPAROSCOPIC ABDOMINAL (DRAPES) ×4 IMPLANT
DRAPE WARM FLUID 44X44 (DRAPE) ×4 IMPLANT
DRSG OPSITE POSTOP 4X10 (GAUZE/BANDAGES/DRESSINGS) IMPLANT
DRSG OPSITE POSTOP 4X8 (GAUZE/BANDAGES/DRESSINGS) IMPLANT
ELECT BLADE 6.5 EXT (BLADE) IMPLANT
ELECT CAUTERY BLADE 6.4 (BLADE) ×4 IMPLANT
ELECT REM PT RETURN 9FT ADLT (ELECTROSURGICAL) ×4
ELECTRODE REM PT RTRN 9FT ADLT (ELECTROSURGICAL) ×2 IMPLANT
GLOVE BIO SURGEON STRL SZ8 (GLOVE) ×4 IMPLANT
GLOVE BIOGEL PI IND STRL 8 (GLOVE) ×2 IMPLANT
GLOVE BIOGEL PI INDICATOR 8 (GLOVE) ×2
GOWN STRL REUS W/ TWL LRG LVL3 (GOWN DISPOSABLE) ×2 IMPLANT
GOWN STRL REUS W/ TWL XL LVL3 (GOWN DISPOSABLE) ×2 IMPLANT
GOWN STRL REUS W/TWL LRG LVL3 (GOWN DISPOSABLE) ×2
GOWN STRL REUS W/TWL XL LVL3 (GOWN DISPOSABLE) ×2
KIT BASIN OR (CUSTOM PROCEDURE TRAY) ×4 IMPLANT
KIT ROOM TURNOVER OR (KITS) ×4 IMPLANT
LIGASURE IMPACT 36 18CM CVD LR (INSTRUMENTS) ×4 IMPLANT
NS IRRIG 1000ML POUR BTL (IV SOLUTION) ×8 IMPLANT
PACK GENERAL/GYN (CUSTOM PROCEDURE TRAY) ×4 IMPLANT
PAD ARMBOARD 7.5X6 YLW CONV (MISCELLANEOUS) ×4 IMPLANT
RELOAD PROXIMATE 75MM BLUE (ENDOMECHANICALS) ×28 IMPLANT
SPECIMEN JAR LARGE (MISCELLANEOUS) IMPLANT
SPONGE ABDOMINAL VAC ABTHERA (MISCELLANEOUS) ×4 IMPLANT
SPONGE GAUZE 4X4 12PLY STER LF (GAUZE/BANDAGES/DRESSINGS) ×4 IMPLANT
SPONGE LAP 18X18 X RAY DECT (DISPOSABLE) ×20 IMPLANT
STAPLER GUN LINEAR PROX 60 (STAPLE) ×4 IMPLANT
STAPLER PROXIMATE 75MM BLUE (STAPLE) ×4 IMPLANT
STAPLER VISISTAT 35W (STAPLE) ×4 IMPLANT
SUCTION POOLE TIP (SUCTIONS) ×4 IMPLANT
SUT PDS AB 1 TP1 96 (SUTURE) ×16 IMPLANT
SUT SILK 2 0 SH CR/8 (SUTURE) ×4 IMPLANT
SUT SILK 2 0 TIES 10X30 (SUTURE) ×4 IMPLANT
SUT SILK 3 0 SH CR/8 (SUTURE) ×4 IMPLANT
SUT VIC AB 2-0 SH 18 (SUTURE) ×4 IMPLANT
SUT VIC AB 3-0 SH 18 (SUTURE) ×8 IMPLANT
SUT VICRYL AB 2 0 TIES (SUTURE) ×4 IMPLANT
SUT VICRYL AB 3 0 TIES (SUTURE) ×4 IMPLANT
TOWEL OR 17X24 6PK STRL BLUE (TOWEL DISPOSABLE) ×4 IMPLANT
TOWEL OR 17X26 10 PK STRL BLUE (TOWEL DISPOSABLE) ×4 IMPLANT
TRAY FOLEY CATH 16FRSI W/METER (SET/KITS/TRAYS/PACK) IMPLANT
YANKAUER SUCT BULB TIP NO VENT (SUCTIONS) IMPLANT

## 2016-04-11 NOTE — Anesthesia Procedure Notes (Signed)
Procedure Name: Intubation Date/Time: 04/11/2016 11:38 PM Performed by: Melina SchoolsBANKS, Jobanny Mavis J Pre-anesthesia Checklist: Patient identified, Emergency Drugs available, Suction available, Patient being monitored and Timeout performed Patient Re-evaluated:Patient Re-evaluated prior to inductionOxygen Delivery Method: Circle system utilized Preoxygenation: Pre-oxygenation with 100% oxygen Intubation Type: IV induction and Rapid sequence Laryngoscope Size: Mac and 3 Grade View: Grade I Tube type: Oral Tube size: 7.5 mm Number of attempts: 1 Airway Equipment and Method: Stylet Placement Confirmation: ETT inserted through vocal cords under direct vision,  positive ETCO2 and breath sounds checked- equal and bilateral Secured at: 21 cm Tube secured with: Tape Dental Injury: Teeth and Oropharynx as per pre-operative assessment
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
## 2016-04-12 ENCOUNTER — Encounter (HOSPITAL_COMMUNITY): Payer: Self-pay | Admitting: Surgery

## 2016-04-12 ENCOUNTER — Inpatient Hospital Stay (HOSPITAL_COMMUNITY): Payer: Medicaid Other

## 2016-04-12 DIAGNOSIS — S31640A Puncture wound with foreign body of abdominal wall, right upper quadrant with penetration into peritoneal cavity, initial encounter: Secondary | ICD-10-CM | POA: Diagnosis present

## 2016-04-12 DIAGNOSIS — J9690 Respiratory failure, unspecified, unspecified whether with hypoxia or hypercapnia: Secondary | ICD-10-CM | POA: Diagnosis not present

## 2016-04-12 DIAGNOSIS — D62 Acute posthemorrhagic anemia: Secondary | ICD-10-CM | POA: Diagnosis not present

## 2016-04-12 DIAGNOSIS — S31109A Unspecified open wound of abdominal wall, unspecified quadrant without penetration into peritoneal cavity, initial encounter: Secondary | ICD-10-CM | POA: Diagnosis present

## 2016-04-12 DIAGNOSIS — J45909 Unspecified asthma, uncomplicated: Secondary | ICD-10-CM | POA: Diagnosis present

## 2016-04-12 DIAGNOSIS — F17210 Nicotine dependence, cigarettes, uncomplicated: Secondary | ICD-10-CM | POA: Diagnosis present

## 2016-04-12 DIAGNOSIS — K659 Peritonitis, unspecified: Secondary | ICD-10-CM | POA: Diagnosis present

## 2016-04-12 DIAGNOSIS — W3400XA Accidental discharge from unspecified firearms or gun, initial encounter: Secondary | ICD-10-CM

## 2016-04-12 DIAGNOSIS — S36598A Other injury of other part of colon, initial encounter: Secondary | ICD-10-CM | POA: Diagnosis present

## 2016-04-12 DIAGNOSIS — I959 Hypotension, unspecified: Secondary | ICD-10-CM | POA: Diagnosis present

## 2016-04-12 LAB — COMPREHENSIVE METABOLIC PANEL
ALK PHOS: 30 U/L — AB (ref 38–126)
ALT: 23 U/L (ref 14–54)
AST: 28 U/L (ref 15–41)
Albumin: 3 g/dL — ABNORMAL LOW (ref 3.5–5.0)
Anion gap: 5 (ref 5–15)
BILIRUBIN TOTAL: 0.6 mg/dL (ref 0.3–1.2)
BUN: 9 mg/dL (ref 6–20)
CALCIUM: 7.7 mg/dL — AB (ref 8.9–10.3)
CO2: 21 mmol/L — ABNORMAL LOW (ref 22–32)
CREATININE: 0.76 mg/dL (ref 0.44–1.00)
Chloride: 111 mmol/L (ref 101–111)
GFR calc Af Amer: >60 mL/min (ref >60)
GLUCOSE: 205 mg/dL — AB (ref 65–99)
POTASSIUM: 3.8 mmol/L (ref 3.5–5.1)
Sodium: 137 mmol/L (ref 135–145)
TOTAL PROTEIN: 5.1 g/dL — AB (ref 6.5–8.1)

## 2016-04-12 LAB — POCT I-STAT 7, (LYTES, BLD GAS, ICA,H+H)
Acid-base deficit: 6 mmol/L — ABNORMAL HIGH (ref 0.0–2.0)
BICARBONATE: 20.6 meq/L (ref 20.0–24.0)
Calcium, Ion: 1.17 mmol/L (ref 1.13–1.30)
HCT: 30 % — ABNORMAL LOW (ref 36.0–46.0)
Hemoglobin: 10.2 g/dL — ABNORMAL LOW (ref 12.0–15.0)
O2 Saturation: 93 %
PCO2 ART: 39.8 mmHg (ref 35.0–45.0)
PO2 ART: 65 mmHg — AB (ref 80.0–100.0)
Potassium: 3.1 mmol/L — ABNORMAL LOW (ref 3.5–5.1)
Sodium: 141 mmol/L (ref 135–145)
TCO2: 22 mmol/L (ref 0–100)
pH, Arterial: 7.31 — ABNORMAL LOW (ref 7.350–7.450)

## 2016-04-12 LAB — TYPE AND SCREEN
ABO/RH(D): B POS
Antibody Screen: NEGATIVE
UNIT DIVISION: 0
Unit division: 0

## 2016-04-12 LAB — CBC WITH DIFFERENTIAL/PLATELET
BASOS ABS: 0 10*3/uL (ref 0.0–0.1)
Basophils Relative: 0 %
EOS ABS: 0 10*3/uL (ref 0.0–0.7)
EOS PCT: 0 %
HCT: 31.5 % — ABNORMAL LOW (ref 36.0–46.0)
HEMOGLOBIN: 9.7 g/dL — AB (ref 12.0–15.0)
LYMPHS ABS: 0.9 10*3/uL (ref 0.7–4.0)
Lymphocytes Relative: 6 %
MCH: 25.2 pg — ABNORMAL LOW (ref 26.0–34.0)
MCHC: 30.8 g/dL (ref 30.0–36.0)
MCV: 81.8 fL (ref 78.0–100.0)
Monocytes Absolute: 0.3 10*3/uL (ref 0.1–1.0)
Monocytes Relative: 2 %
NEUTROS PCT: 92 %
Neutro Abs: 13.1 10*3/uL — ABNORMAL HIGH (ref 1.7–7.7)
PLATELETS: 244 10*3/uL (ref 150–400)
RBC: 3.85 MIL/uL — AB (ref 3.87–5.11)
RDW: 14 % (ref 11.5–15.5)
WBC: 14.3 10*3/uL — AB (ref 4.0–10.5)

## 2016-04-12 LAB — ETHANOL

## 2016-04-12 LAB — BLOOD GAS, ARTERIAL
ACID-BASE DEFICIT: 4.9 mmol/L — AB (ref 0.0–2.0)
BICARBONATE: 20.6 meq/L (ref 20.0–24.0)
Drawn by: 31101
FIO2: 100
LHR: 15 {breaths}/min
O2 Saturation: 99.5 %
PATIENT TEMPERATURE: 98.6
PCO2 ART: 44.6 mmHg (ref 35.0–45.0)
PEEP/CPAP: 8 cmH2O
PO2 ART: 246 mmHg — AB (ref 80.0–100.0)
TCO2: 22 mmol/L (ref 0–100)
VT: 500 mL
pH, Arterial: 7.287 — ABNORMAL LOW (ref 7.350–7.450)

## 2016-04-12 LAB — CBC
HEMATOCRIT: 35.3 % — AB (ref 36.0–46.0)
HEMOGLOBIN: 11 g/dL — AB (ref 12.0–15.0)
MCH: 25.1 pg — ABNORMAL LOW (ref 26.0–34.0)
MCHC: 31.2 g/dL (ref 30.0–36.0)
MCV: 80.6 fL (ref 78.0–100.0)
Platelets: 317 10*3/uL (ref 150–400)
RBC: 4.38 MIL/uL (ref 3.87–5.11)
RDW: 13.7 % (ref 11.5–15.5)
WBC: 11.5 10*3/uL — AB (ref 4.0–10.5)

## 2016-04-12 LAB — PREPARE FRESH FROZEN PLASMA
UNIT DIVISION: 0
Unit division: 0

## 2016-04-12 LAB — URINE MICROSCOPIC-ADD ON

## 2016-04-12 LAB — BLOOD PRODUCT ORDER (VERBAL) VERIFICATION

## 2016-04-12 LAB — URINALYSIS, ROUTINE W REFLEX MICROSCOPIC
BILIRUBIN URINE: NEGATIVE
GLUCOSE, UA: 250 mg/dL — AB
Ketones, ur: NEGATIVE mg/dL
Leukocytes, UA: NEGATIVE
Nitrite: NEGATIVE
PROTEIN: NEGATIVE mg/dL
Specific Gravity, Urine: 1.019 (ref 1.005–1.030)
pH: 5 (ref 5.0–8.0)

## 2016-04-12 LAB — TRIGLYCERIDES: TRIGLYCERIDES: 47 mg/dL (ref <150)

## 2016-04-12 LAB — CDS SEROLOGY

## 2016-04-12 LAB — MRSA PCR SCREENING: MRSA by PCR: NEGATIVE

## 2016-04-12 LAB — PROTIME-INR
INR: 1.52
PROTHROMBIN TIME: 18.5 s — AB (ref 11.4–15.2)

## 2016-04-12 MED ORDER — FENTANYL BOLUS VIA INFUSION
50.0000 ug | INTRAVENOUS | Status: DC | PRN
  Administered 2016-04-12 (×2): 50 ug via INTRAVENOUS
  Filled 2016-04-12: qty 50

## 2016-04-12 MED ORDER — ANTISEPTIC ORAL RINSE SOLUTION (CORINZ)
7.0000 mL | OROMUCOSAL | Status: DC
  Administered 2016-04-12 – 2016-04-14 (×18): 7 mL via OROMUCOSAL

## 2016-04-12 MED ORDER — DEXTROSE 5 % IV SOLN
1.0000 g | Freq: Three times a day (TID) | INTRAVENOUS | Status: DC
  Administered 2016-04-12 – 2016-04-16 (×13): 1 g via INTRAVENOUS
  Filled 2016-04-12 (×15): qty 1

## 2016-04-12 MED ORDER — CHLORHEXIDINE GLUCONATE 0.12% ORAL RINSE (MEDLINE KIT)
15.0000 mL | Freq: Two times a day (BID) | OROMUCOSAL | Status: DC
  Administered 2016-04-12: 15 mL via OROMUCOSAL

## 2016-04-12 MED ORDER — MIDAZOLAM HCL 2 MG/2ML IJ SOLN
INTRAMUSCULAR | Status: AC
Start: 2016-04-12 — End: 2016-04-12
  Filled 2016-04-12: qty 2

## 2016-04-12 MED ORDER — ONDANSETRON HCL 4 MG PO TABS
4.0000 mg | ORAL_TABLET | Freq: Four times a day (QID) | ORAL | Status: DC | PRN
Start: 2016-04-12 — End: 2016-04-18

## 2016-04-12 MED ORDER — ENOXAPARIN SODIUM 40 MG/0.4ML ~~LOC~~ SOLN
40.0000 mg | Freq: Every day | SUBCUTANEOUS | Status: DC
  Administered 2016-04-12 – 2016-04-18 (×6): 40 mg via SUBCUTANEOUS
  Filled 2016-04-12 (×6): qty 0.4

## 2016-04-12 MED ORDER — CHLORHEXIDINE GLUCONATE 0.12% ORAL RINSE (MEDLINE KIT)
15.0000 mL | Freq: Two times a day (BID) | OROMUCOSAL | Status: DC
  Administered 2016-04-12 – 2016-04-14 (×4): 15 mL via OROMUCOSAL

## 2016-04-12 MED ORDER — ALBUMIN HUMAN 5 % IV SOLN
INTRAVENOUS | Status: DC | PRN
  Administered 2016-04-12: via INTRAVENOUS

## 2016-04-12 MED ORDER — PROPOFOL 1000 MG/100ML IV EMUL
5.0000 ug/kg/min | INTRAVENOUS | Status: DC
  Administered 2016-04-12: 20 ug/kg/min via INTRAVENOUS
  Administered 2016-04-12: 30.025 ug/kg/min via INTRAVENOUS
  Administered 2016-04-13: 50 ug/kg/min via INTRAVENOUS
  Administered 2016-04-13: 25 ug/kg/min via INTRAVENOUS
  Administered 2016-04-14: 40 ug/kg/min via INTRAVENOUS
  Administered 2016-04-14: 25 ug/kg/min via INTRAVENOUS
  Filled 2016-04-12 (×8): qty 100

## 2016-04-12 MED ORDER — SODIUM CHLORIDE 0.9 % IV SOLN
500.0000 mL | Freq: Once | INTRAVENOUS | Status: AC
  Administered 2016-04-12: 500 mL via INTRAVENOUS

## 2016-04-12 MED ORDER — LORAZEPAM 2 MG/ML IJ SOLN: 1.0000 mg | INTRAMUSCULAR | Status: DC | PRN

## 2016-04-12 MED ORDER — ALBUMIN HUMAN 5 % IV SOLN
25.0000 g | Freq: Once | INTRAVENOUS | Status: AC
  Administered 2016-04-12: 25 g via INTRAVENOUS
  Filled 2016-04-12: qty 250

## 2016-04-12 MED ORDER — ONDANSETRON HCL 4 MG/2ML IJ SOLN
4.0000 mg | Freq: Four times a day (QID) | INTRAMUSCULAR | Status: DC | PRN
  Administered 2016-04-15 – 2016-04-16 (×3): 4 mg via INTRAVENOUS
  Filled 2016-04-12 (×3): qty 2

## 2016-04-12 MED ORDER — KCL IN DEXTROSE-NACL 20-5-0.9 MEQ/L-%-% IV SOLN
INTRAVENOUS | Status: DC
  Administered 2016-04-12 – 2016-04-17 (×11): via INTRAVENOUS
  Filled 2016-04-12 (×17): qty 1000

## 2016-04-12 MED ORDER — 0.9 % SODIUM CHLORIDE (POUR BTL) OPTIME
TOPICAL | Status: DC | PRN
  Administered 2016-04-12: 1000 mL
  Administered 2016-04-12: 2000 mL

## 2016-04-12 MED ORDER — SODIUM CHLORIDE 0.9 % IV SOLN
INTRAVENOUS | Status: DC | PRN
  Administered 2016-04-11: 1000 mL via INTRAVENOUS

## 2016-04-12 MED ORDER — FENTANYL CITRATE (PF) 100 MCG/2ML IJ SOLN: 50.0000 ug | Freq: Once | INTRAMUSCULAR | Status: DC

## 2016-04-12 MED ORDER — PHENYLEPHRINE HCL 10 MG/ML IJ SOLN
INTRAVENOUS | Status: DC | PRN
  Administered 2016-04-12: 50 ug/min via INTRAVENOUS

## 2016-04-12 MED ORDER — ANTISEPTIC ORAL RINSE SOLUTION (CORINZ)
7.0000 mL | Freq: Four times a day (QID) | OROMUCOSAL | Status: DC
  Administered 2016-04-12: 7 mL via OROMUCOSAL

## 2016-04-12 MED ORDER — ALBUTEROL SULFATE (2.5 MG/3ML) 0.083% IN NEBU
2.5000 mg | INHALATION_SOLUTION | Freq: Four times a day (QID) | RESPIRATORY_TRACT | Status: DC
  Administered 2016-04-12 – 2016-04-14 (×10): 2.5 mg via RESPIRATORY_TRACT
  Filled 2016-04-12 (×10): qty 3

## 2016-04-12 MED ORDER — ALBUTEROL SULFATE (2.5 MG/3ML) 0.083% IN NEBU: 2.5000 mg | INHALATION_SOLUTION | Freq: Four times a day (QID) | RESPIRATORY_TRACT | Status: DC | PRN

## 2016-04-12 MED ORDER — SODIUM CHLORIDE 0.9 % IV SOLN
25.0000 ug/h | INTRAVENOUS | Status: DC
  Administered 2016-04-12 – 2016-04-13 (×2): 100 ug/h via INTRAVENOUS
  Filled 2016-04-12 (×3): qty 50

## 2016-04-12 MED ORDER — HYDROMORPHONE HCL 1 MG/ML IJ SOLN
1.0000 mg | INTRAMUSCULAR | Status: DC | PRN
  Administered 2016-04-12 (×2): 1 mg via INTRAVENOUS
  Filled 2016-04-12 (×2): qty 1

## 2016-04-12 MED ORDER — ALBUTEROL SULFATE (2.5 MG/3ML) 0.083% IN NEBU: 2.5000 mg | INHALATION_SOLUTION | Freq: Four times a day (QID) | RESPIRATORY_TRACT | Status: DC

## 2016-04-12 MED ORDER — FAMOTIDINE IN NACL 20-0.9 MG/50ML-% IV SOLN
20.0000 mg | Freq: Two times a day (BID) | INTRAVENOUS | Status: DC
  Administered 2016-04-12 – 2016-04-18 (×13): 20 mg via INTRAVENOUS
  Filled 2016-04-12 (×15): qty 50

## 2016-04-12 NOTE — OR Nursing (Signed)
ICU first call 0120
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
## 2016-04-12 NOTE — Progress Notes (Signed)
Patient is wide awake on the ventilator and wants it removed.  Unfortunately she has an open abdomen and she has to go back to the OR tomorrow.  Should be able to close at that time.  Pressure a bit soft, and probably needs more volume.  Will measure CVP.  Give bolus of saline and albumin.  Recheck labs later today  Marta LamasJames O. Gae BonWyatt, III, MD, FACS [phone] Trauma Surgeon
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
## 2016-04-12 NOTE — Anesthesia Preprocedure Evaluation (Signed)
Anesthesia Evaluation  Patient identified by MRN, date of birth, ID band Patient confused    Reviewed: Unable to perform ROS - Chart review only  Airway Mallampati: II   Neck ROM: Full    Dental  (+) Poor Dentition   Pulmonary Current Smoker,     + decreased breath sounds+ wheezing      Cardiovascular  Rhythm:Regular Rate:Tachycardia     Neuro/Psych    GI/Hepatic   Endo/Other    Renal/GU      Musculoskeletal   Abdominal (+) + obese,   Peds  Hematology   Anesthesia Other Findings   Reproductive/Obstetrics                             Anesthesia Physical Anesthesia Plan  ASA: IV and emergent  Anesthesia Plan: General   Post-op Pain Management:    Induction: Intravenous, Rapid sequence and Cricoid pressure planned  Airway Management Planned: Oral ETT  Additional Equipment: Arterial line and CVP  Intra-op Plan:   Post-operative Plan: Possible Post-op intubation/ventilation  Informed Consent:   Plan Discussed with: CRNA and Anesthesiologist  Anesthesia Plan Comments:         Anesthesia Quick Evaluation
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
## 2016-04-12 NOTE — Transfer of Care (Signed)
Immediate Anesthesia Transfer of Care Note  Patient: [name] XXXWomber  Procedure(s) Performed: Procedure(s): EXPLORATORY LAPAROTOMY (N/A) SMALL BOWEL RESECTION AND CECECTOMY APPLICATION OF ABDOMINAL WOUND VAC  Patient Location: ICU  Anesthesia Type:General  Level of Consciousness: sedated and Patient remains intubated per anesthesia plan  Airway & Oxygen Therapy: Patient remains intubated per anesthesia plan and Patient placed on Ventilator (see vital sign flow sheet for setting)  Post-op Assessment: Report given to RN and Post -op Vital signs reviewed and stable  Post vital signs: Reviewed and stable  Last Vitals:  Vitals:   04/11/16 2332 04/12/16 0212  BP: 126/78 (!) 157/88  Pulse: 100 98  Resp: (!) 46 15  Temp: 36.3 C     Last Pain:  Vitals:   04/11/16 2347  TempSrc:   PainSc: 10-Worst pain ever         Complications: No apparent anesthesia complications
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
## 2016-04-13 ENCOUNTER — Inpatient Hospital Stay (HOSPITAL_COMMUNITY): Payer: Medicaid Other | Admitting: Critical Care Medicine

## 2016-04-13 ENCOUNTER — Encounter (HOSPITAL_COMMUNITY): Admission: EM | Disposition: A | Discharge status: Home Health | Payer: Self-pay | Source: Home / Self Care

## 2016-04-13 ENCOUNTER — Inpatient Hospital Stay (HOSPITAL_COMMUNITY): Payer: Medicaid Other

## 2016-04-13 ENCOUNTER — Encounter (HOSPITAL_COMMUNITY): Payer: Self-pay | Admitting: Anesthesiology

## 2016-04-13 HISTORY — PX: LAPAROTOMY: SHX154

## 2016-04-13 LAB — CBC
HEMATOCRIT: 29.6 % — AB (ref 36.0–46.0)
Hemoglobin: 9.2 g/dL — ABNORMAL LOW (ref 12.0–15.0)
MCH: 25.2 pg — ABNORMAL LOW (ref 26.0–34.0)
MCHC: 31.1 g/dL (ref 30.0–36.0)
MCV: 81.1 fL (ref 78.0–100.0)
Platelets: 235 10*3/uL (ref 150–400)
RBC: 3.65 MIL/uL — ABNORMAL LOW (ref 3.87–5.11)
RDW: 14.1 % (ref 11.5–15.5)
WBC: 12.2 10*3/uL — AB (ref 4.0–10.5)

## 2016-04-13 LAB — BASIC METABOLIC PANEL
ANION GAP: 7 (ref 5–15)
BUN: <5 mg/dL — ABNORMAL LOW (ref 6–20)
CALCIUM: 7.8 mg/dL — AB (ref 8.9–10.3)
CO2: 23 mmol/L (ref 22–32)
CREATININE: 0.73 mg/dL (ref 0.44–1.00)
Chloride: 108 mmol/L (ref 101–111)
GFR calc non Af Amer: >60 mL/min (ref >60)
Glucose, Bld: 135 mg/dL — ABNORMAL HIGH (ref 65–99)
Potassium: 3.7 mmol/L (ref 3.5–5.1)
SODIUM: 138 mmol/L (ref 135–145)

## 2016-04-13 SURGERY — LAPAROTOMY, EXPLORATORY
Anesthesia: General | Site: Abdomen

## 2016-04-13 MED ORDER — FENTANYL CITRATE (PF) 100 MCG/2ML IJ SOLN
INTRAMUSCULAR | Status: DC | PRN
  Administered 2016-04-13 (×3): 100 ug via INTRAVENOUS

## 2016-04-13 MED ORDER — FENTANYL CITRATE (PF) 100 MCG/2ML IJ SOLN
INTRAMUSCULAR | Status: AC
  Filled 2016-04-13: qty 2

## 2016-04-13 MED ORDER — ROCURONIUM BROMIDE 100 MG/10ML IV SOLN
INTRAVENOUS | Status: DC | PRN
  Administered 2016-04-13: 20 mg via INTRAVENOUS
  Administered 2016-04-13: 50 mg via INTRAVENOUS
  Administered 2016-04-13: 10 mg via INTRAVENOUS

## 2016-04-13 MED ORDER — LACTATED RINGERS IV SOLN
INTRAVENOUS | Status: DC | PRN
  Administered 2016-04-13: 17:00:00 via INTRAVENOUS

## 2016-04-13 MED ORDER — MIDAZOLAM HCL 2 MG/2ML IJ SOLN
INTRAMUSCULAR | Status: AC
  Filled 2016-04-13: qty 2

## 2016-04-13 MED ORDER — FENTANYL CITRATE (PF) 100 MCG/2ML IJ SOLN
INTRAMUSCULAR | Status: AC
  Filled 2016-04-13: qty 4

## 2016-04-13 MED ORDER — PHENYLEPHRINE HCL 10 MG/ML IJ SOLN
INTRAVENOUS | Status: DC | PRN
  Administered 2016-04-13: 25 ug/min via INTRAVENOUS

## 2016-04-13 MED ORDER — PROPOFOL 10 MG/ML IV BOLUS
INTRAVENOUS | Status: AC
  Filled 2016-04-13: qty 20

## 2016-04-13 MED ORDER — ONDANSETRON HCL 4 MG/2ML IJ SOLN
INTRAMUSCULAR | Status: DC | PRN
  Administered 2016-04-13: 4 mg via INTRAVENOUS

## 2016-04-13 MED ORDER — PROPOFOL 500 MG/50ML IV EMUL
INTRAVENOUS | Status: DC | PRN
  Administered 2016-04-13: 50 ug/kg/min via INTRAVENOUS

## 2016-04-13 MED ORDER — MIDAZOLAM HCL 5 MG/5ML IJ SOLN
INTRAMUSCULAR | Status: DC | PRN
  Administered 2016-04-13: 2 mg via INTRAVENOUS

## 2016-04-13 MED ORDER — 0.9 % SODIUM CHLORIDE (POUR BTL) OPTIME
TOPICAL | Status: DC | PRN
  Administered 2016-04-13 (×2): 1000 mL

## 2016-04-13 MED ORDER — PROPOFOL 1000 MG/100ML IV EMUL
INTRAVENOUS | Status: AC
  Filled 2016-04-13: qty 100

## 2016-04-13 SURGICAL SUPPLY — 50 items
BLADE SURG ROTATE 9660 (MISCELLANEOUS) IMPLANT
BNDG GAUZE ELAST 4 BULKY (GAUZE/BANDAGES/DRESSINGS) ×3 IMPLANT
CANISTER SUCTION 2500CC (MISCELLANEOUS) ×3 IMPLANT
CHLORAPREP W/TINT 26ML (MISCELLANEOUS) ×3 IMPLANT
COVER SURGICAL LIGHT HANDLE (MISCELLANEOUS) ×3 IMPLANT
DRAPE LAPAROSCOPIC ABDOMINAL (DRAPES) ×3 IMPLANT
DRAPE WARM FLUID 44X44 (DRAPE) ×3 IMPLANT
DRSG OPSITE POSTOP 4X10 (GAUZE/BANDAGES/DRESSINGS) IMPLANT
DRSG OPSITE POSTOP 4X8 (GAUZE/BANDAGES/DRESSINGS) IMPLANT
DRSG PAD ABDOMINAL 8X10 ST (GAUZE/BANDAGES/DRESSINGS) ×6 IMPLANT
ELECT BLADE 6.5 EXT (BLADE) IMPLANT
ELECT CAUTERY BLADE 6.4 (BLADE) ×3 IMPLANT
ELECT REM PT RETURN 9FT ADLT (ELECTROSURGICAL) ×3
ELECTRODE REM PT RTRN 9FT ADLT (ELECTROSURGICAL) ×1 IMPLANT
GAUZE SPONGE 4X4 12PLY STRL (GAUZE/BANDAGES/DRESSINGS) ×3 IMPLANT
GLOVE BIO SURGEON STRL SZ8 (GLOVE) ×6 IMPLANT
GLOVE BIOGEL PI IND STRL 8 (GLOVE) ×5 IMPLANT
GLOVE BIOGEL PI INDICATOR 8 (GLOVE) ×10
GLOVE ECLIPSE 7.5 STRL STRAW (GLOVE) ×6 IMPLANT
GLOVE SURG SS PI 6.5 STRL IVOR (GLOVE) ×3 IMPLANT
GLOVE SURG SS PI 7.0 STRL IVOR (GLOVE) ×6 IMPLANT
GOWN STRL REUS W/ TWL LRG LVL3 (GOWN DISPOSABLE) ×3 IMPLANT
GOWN STRL REUS W/ TWL XL LVL3 (GOWN DISPOSABLE) ×2 IMPLANT
GOWN STRL REUS W/TWL LRG LVL3 (GOWN DISPOSABLE) ×6
GOWN STRL REUS W/TWL XL LVL3 (GOWN DISPOSABLE) ×4
KIT BASIN OR (CUSTOM PROCEDURE TRAY) ×3 IMPLANT
KIT ROOM TURNOVER OR (KITS) ×3 IMPLANT
LIGASURE IMPACT 36 18CM CVD LR (INSTRUMENTS) IMPLANT
NS IRRIG 1000ML POUR BTL (IV SOLUTION) ×6 IMPLANT
PACK GENERAL/GYN (CUSTOM PROCEDURE TRAY) ×3 IMPLANT
PAD ARMBOARD 7.5X6 YLW CONV (MISCELLANEOUS) ×3 IMPLANT
PENCIL BUTTON HOLSTER BLD 10FT (ELECTRODE) ×3 IMPLANT
RELOAD PROXIMATE 75MM BLUE (ENDOMECHANICALS) ×12 IMPLANT
SPECIMEN JAR LARGE (MISCELLANEOUS) IMPLANT
SPONGE LAP 18X18 X RAY DECT (DISPOSABLE) IMPLANT
STAPLER PROXIMATE 75MM BLUE (STAPLE) ×9 IMPLANT
STAPLER VISISTAT 35W (STAPLE) IMPLANT
SUCTION POOLE TIP (SUCTIONS) ×3 IMPLANT
SUT PDS AB 1 TP1 96 (SUTURE) ×9 IMPLANT
SUT PDS II 0 TP-1 LOOPED 60 (SUTURE) ×3 IMPLANT
SUT SILK 2 0 (SUTURE) ×2
SUT SILK 2 0 SH CR/8 (SUTURE) ×3 IMPLANT
SUT SILK 2 0 TIES 10X30 (SUTURE) ×3 IMPLANT
SUT SILK 2-0 18XBRD TIE 12 (SUTURE) ×1 IMPLANT
SUT SILK 3 0 SH CR/8 (SUTURE) ×3 IMPLANT
SUT SILK 3 0 TIES 10X30 (SUTURE) ×3 IMPLANT
SUT VLOC 180 0 24IN GS25 (SUTURE) ×6 IMPLANT
TOWEL OR 17X26 10 PK STRL BLUE (TOWEL DISPOSABLE) ×3 IMPLANT
TRAY FOLEY CATH 16FRSI W/METER (SET/KITS/TRAYS/PACK) IMPLANT
YANKAUER SUCT BULB TIP NO VENT (SUCTIONS) ×3 IMPLANT
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
## 2016-04-13 NOTE — Progress Notes (Signed)
   04/13/16 1624  Clinical Encounter Type  Visited With Family;Health care provider  Visit Type Follow-up;Patient in surgery  Spiritual Encounters  Spiritual Needs Emotional   Chaplain provided follow-up care. Patient's daughter is a bit emotional as mother has been taken to the operating room. Daughter is with her cousin as well. Chaplain offered support. Spiritual care services available as needed.   Alda PonderAdam M Zachari Alberta, Chaplain 04/13/16 4:25 PM

## 2016-04-13 NOTE — Anesthesia Preprocedure Evaluation (Signed)
Anesthesia Evaluation  Patient identified by MRN, date of birth, ID band Patient unresponsive  General Assessment Comment:Pt intubated.  Reviewed: Unable to perform ROS - Chart review only  Airway Mallampati: Intubated       Dental  (+) Poor Dentition   Pulmonary asthma , Current Smoker,     + decreased breath sounds+ wheezing      Cardiovascular negative cardio ROS   Rhythm:Regular Rate:Tachycardia     Neuro/Psych    GI/Hepatic   Endo/Other    Renal/GU      Musculoskeletal   Abdominal (+) + obese,   Peds  Hematology   Anesthesia Other Findings   Reproductive/Obstetrics                             Anesthesia Physical  Anesthesia Plan  ASA: IV and emergent  Anesthesia Plan: General   Post-op Pain Management:    Induction: Inhalational  Airway Management Planned: Oral ETT  Additional Equipment: Arterial line and CVP  Intra-op Plan:   Post-operative Plan: Post-operative intubation/ventilation  Informed Consent: I have reviewed the patients History and Physical, chart, labs and discussed the procedure including the risks, benefits and alternatives for the proposed anesthesia with the patient or authorized representative who has indicated his/her understanding and acceptance.     Plan Discussed with: CRNA, Anesthesiologist and Surgeon  Anesthesia Plan Comments:         Anesthesia Quick Evaluation
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
## 2016-04-13 NOTE — Transfer of Care (Signed)
Immediate Anesthesia Transfer of Care Note  Patient: Kara Hunter  Procedure(s) Performed: Procedure(s): EXPLORATORY LAPAROTOMY (N/A)  Patient Location: ICU  Anesthesia Type:General  Level of Consciousness: unresponsive and Patient remains intubated per anesthesia plan  Airway & Oxygen Therapy: Patient remains intubated per anesthesia plan and Patient placed on Ventilator (see vital sign flow sheet for setting)  Post-op Assessment: Report given to RN and Post -op Vital signs reviewed and stable  Post vital signs: Reviewed and stable  Last Vitals:  Vitals:   04/13/16 1345 04/13/16 1547  BP: 100/68 108/69  Pulse: (!) 118 (!) 115  Resp: (!) 22 (!) 28  Temp:      Last Pain:  Vitals:   04/13/16 1200  TempSrc: Axillary  PainSc:          Complications: No apparent anesthesia complications

## 2016-04-14 ENCOUNTER — Encounter (HOSPITAL_COMMUNITY): Payer: Self-pay | Admitting: General Surgery

## 2016-04-14 LAB — CBC
HCT: 32 % — ABNORMAL LOW (ref 36.0–46.0)
HEMOGLOBIN: 9.8 g/dL — AB (ref 12.0–15.0)
MCH: 24.9 pg — ABNORMAL LOW (ref 26.0–34.0)
MCHC: 30.6 g/dL (ref 30.0–36.0)
MCV: 81.4 fL (ref 78.0–100.0)
PLATELETS: 272 10*3/uL (ref 150–400)
RBC: 3.93 MIL/uL (ref 3.87–5.11)
RDW: 14 % (ref 11.5–15.5)
WBC: 7.6 10*3/uL (ref 4.0–10.5)

## 2016-04-14 LAB — BASIC METABOLIC PANEL
Anion gap: 8 (ref 5–15)
CHLORIDE: 103 mmol/L (ref 101–111)
CO2: 26 mmol/L (ref 22–32)
CREATININE: 0.75 mg/dL (ref 0.44–1.00)
Calcium: 7.9 mg/dL — ABNORMAL LOW (ref 8.9–10.3)
Glucose, Bld: 146 mg/dL — ABNORMAL HIGH (ref 65–99)
POTASSIUM: 3.9 mmol/L (ref 3.5–5.1)
SODIUM: 137 mmol/L (ref 135–145)

## 2016-04-14 MED ORDER — DIPHENHYDRAMINE HCL 12.5 MG/5ML PO ELIX: 12.5000 mg | ORAL_SOLUTION | Freq: Four times a day (QID) | ORAL | Status: DC | PRN

## 2016-04-14 MED ORDER — CHLORHEXIDINE GLUCONATE 0.12 % MT SOLN
OROMUCOSAL | Status: AC
  Filled 2016-04-14: qty 15

## 2016-04-14 MED ORDER — IPRATROPIUM-ALBUTEROL 0.5-2.5 (3) MG/3ML IN SOLN: 3.0000 mL | Freq: Four times a day (QID) | RESPIRATORY_TRACT | Status: DC

## 2016-04-14 MED ORDER — ONDANSETRON HCL 4 MG/2ML IJ SOLN: 4.0000 mg | Freq: Four times a day (QID) | INTRAMUSCULAR | Status: DC | PRN

## 2016-04-14 MED ORDER — NALOXONE HCL 0.4 MG/ML IJ SOLN: 0.4000 mg | INTRAMUSCULAR | Status: DC | PRN

## 2016-04-14 MED ORDER — HYDROMORPHONE 1 MG/ML IV SOLN
INTRAVENOUS | Status: DC
  Administered 2016-04-14: 22:00:00 via INTRAVENOUS
  Administered 2016-04-15: 1.7 mg via INTRAVENOUS
  Administered 2016-04-15: 1.8 mg via INTRAVENOUS
  Administered 2016-04-15: 0.6 mg via INTRAVENOUS
  Administered 2016-04-15: 1.5 mg via INTRAVENOUS
  Administered 2016-04-15: 0.3 mg via INTRAVENOUS
  Administered 2016-04-15: 1.2 mg via INTRAVENOUS
  Administered 2016-04-15 – 2016-04-16 (×2): 0.6 mg via INTRAVENOUS
  Administered 2016-04-16 (×2): 1.5 mg via INTRAVENOUS
  Administered 2016-04-16: 0.5 mg via INTRAVENOUS
  Administered 2016-04-16: 1.2 mg via INTRAVENOUS
  Administered 2016-04-16: 1.8 mg via INTRAVENOUS
  Administered 2016-04-17 (×2): 0.9 mg via INTRAVENOUS
  Filled 2016-04-14: qty 25

## 2016-04-14 MED ORDER — IPRATROPIUM-ALBUTEROL 0.5-2.5 (3) MG/3ML IN SOLN
3.0000 mL | Freq: Two times a day (BID) | RESPIRATORY_TRACT | Status: DC
  Administered 2016-04-14 – 2016-04-15 (×2): 3 mL via RESPIRATORY_TRACT
  Filled 2016-04-14 (×4): qty 3

## 2016-04-14 MED ORDER — SODIUM CHLORIDE 0.9% FLUSH: 9.0000 mL | INTRAVENOUS | Status: DC | PRN

## 2016-04-14 MED ORDER — CETYLPYRIDINIUM CHLORIDE 0.05 % MT LIQD
7.0000 mL | Freq: Two times a day (BID) | OROMUCOSAL | Status: DC
  Administered 2016-04-14: 7 mL via OROMUCOSAL

## 2016-04-14 MED ORDER — ALBUTEROL SULFATE (2.5 MG/3ML) 0.083% IN NEBU
2.5000 mg | INHALATION_SOLUTION | RESPIRATORY_TRACT | Status: DC | PRN
  Administered 2016-04-14 – 2016-04-15 (×4): 2.5 mg via RESPIRATORY_TRACT
  Filled 2016-04-14 (×4): qty 3

## 2016-04-14 MED ORDER — CHLORHEXIDINE GLUCONATE 0.12 % MT SOLN
15.0000 mL | Freq: Two times a day (BID) | OROMUCOSAL | Status: DC
  Administered 2016-04-14 – 2016-04-18 (×6): 15 mL via OROMUCOSAL
  Filled 2016-04-14 (×8): qty 15

## 2016-04-14 MED ORDER — DIPHENHYDRAMINE HCL 50 MG/ML IJ SOLN: 12.5000 mg | Freq: Four times a day (QID) | INTRAMUSCULAR | Status: DC | PRN

## 2016-04-14 MED ORDER — ORAL CARE MOUTH RINSE
15.0000 mL | Freq: Two times a day (BID) | OROMUCOSAL | Status: DC
  Administered 2016-04-15 – 2016-04-17 (×3): 15 mL via OROMUCOSAL

## 2016-04-14 MED ORDER — FENTANYL CITRATE (PF) 100 MCG/2ML IJ SOLN
25.0000 ug | INTRAMUSCULAR | Status: DC | PRN
  Administered 2016-04-14 (×4): 50 ug via INTRAVENOUS
  Administered 2016-04-14: 25 ug via INTRAVENOUS
  Administered 2016-04-14: 50 ug via INTRAVENOUS
  Filled 2016-04-14 (×6): qty 2

## 2016-04-14 NOTE — Progress Notes (Signed)
Pt c/o "needing to burp". She is complaining that I did not help her. I offered her pain medication. She declined. I offered her a position change, she declined. Pt is strict NPO, so I am unable to give a po simethecon, or maalox. I explained this to her. She wants to speak to the "head nurse". Production designer, theatre/television/filmAdministrator on duty, [place], notified.

## 2016-04-14 NOTE — Consult Note (Addendum)
WOC Nurse wound consult note Reason for Consult: Consult requested for abd Vac placement to 2 wounds; pt is followed by the trauma service. Wound type: 2 full thickness post-op wounds; midline abd 26X4X5cm and right lower abd 5X6X5cm Wound bed: beefy red interspersed with adipose tissue  Drainage (amount, consistency, odor)mod amt pink drainage, no odor  Periwound: Intact skin surrounding Dressing procedure/placement/frequency: Applied one piece black foam to each wound, protected skin with drape, then connected with a bridge to one suction machine at 125mm cont suction.  Pt medicated for pain prior to procedure and tolerated with mod amt discomfort.  Plan for bedside nurse to change Q M/W/F. Please re-consult if further assistance is needed.  Thank-you,  Cammie Mcgeeawn Sonji Starkes MSN, RN, CWOCN, [place], CNS [phone]

## 2016-04-14 NOTE — Progress Notes (Signed)
Pt was extubated exactly at 0900 without any complications. Currently on 4 L O2 VIA [place].Educated patient on oral suctioning, and to cough and deep breath.Also, encouraged patient to use  IS  At least Q 2 hours while awake. Return demonstration noted. Will continue to monitor patient Josie SaundersErnst Delona Clasby, RN
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
## 2016-04-14 NOTE — Procedures (Signed)
Extubation Procedure Note  Patient Details:   Name: [name] XXXWomber DOB: May 01, 1976 MRN: [id number]   Airway Documentation:  Airway 7.5 mm (Active)  Secured at (cm) 25 cm 04/14/2016  7:37 AM  Measured From Lips 04/14/2016  7:37 AM  Secured Location Left 04/14/2016  7:37 AM  Secured By [employer] Tube Holder 04/14/2016  7:37 AM  Tube Holder Repositioned Yes 04/14/2016  7:37 AM  Cuff Pressure (cm H2O) 24 cm H2O 04/14/2016  3:40 AM  Site Condition Dry 04/14/2016  7:37 AM    Evaluation  O2 sats: stable throughout Complications: No apparent complications Patient did tolerate procedure well. Bilateral Breath Sounds: Rhonchi   Yes   Pt. Was extubated to a 4L [place] without any complications, dyspnea or stridor noted. Pt. Had a positive cuff leak & was instructed on IS x 7, highest goal achieved was 500mL.   Nataleah Scioneaux, Margaretmary Dysshley L 04/14/2016, 9:01 AM

## 2016-04-15 LAB — BASIC METABOLIC PANEL
Anion gap: 5 (ref 5–15)
BUN: <5 mg/dL — ABNORMAL LOW (ref 6–20)
CHLORIDE: 106 mmol/L (ref 101–111)
CO2: 27 mmol/L (ref 22–32)
Calcium: 8.2 mg/dL — ABNORMAL LOW (ref 8.9–10.3)
Creatinine, Ser: 0.56 mg/dL (ref 0.44–1.00)
GFR calc non Af Amer: >60 mL/min (ref >60)
Glucose, Bld: 135 mg/dL — ABNORMAL HIGH (ref 65–99)
POTASSIUM: 3.5 mmol/L (ref 3.5–5.1)
SODIUM: 138 mmol/L (ref 135–145)

## 2016-04-15 LAB — CBC
HEMATOCRIT: 28.8 % — AB (ref 36.0–46.0)
HEMOGLOBIN: 9 g/dL — AB (ref 12.0–15.0)
MCH: 25.2 pg — ABNORMAL LOW (ref 26.0–34.0)
MCHC: 31.3 g/dL (ref 30.0–36.0)
MCV: 80.7 fL (ref 78.0–100.0)
Platelets: 252 10*3/uL (ref 150–400)
RBC: 3.57 MIL/uL — AB (ref 3.87–5.11)
RDW: 13.3 % (ref 11.5–15.5)
WBC: 7.3 10*3/uL (ref 4.0–10.5)

## 2016-04-15 LAB — TRIGLYCERIDES: Triglycerides: 85 mg/dL (ref <150)

## 2016-04-15 MED ORDER — IPRATROPIUM-ALBUTEROL 0.5-2.5 (3) MG/3ML IN SOLN
3.0000 mL | RESPIRATORY_TRACT | Status: DC | PRN
  Administered 2016-04-16 (×2): 3 mL via RESPIRATORY_TRACT
  Filled 2016-04-15 (×2): qty 3

## 2016-04-15 MED ORDER — PNEUMOCOCCAL VAC POLYVALENT 25 MCG/0.5ML IJ INJ
0.5000 mL | INJECTION | INTRAMUSCULAR | Status: AC
Start: 2016-04-16 — End: 2016-04-17
  Administered 2016-04-17: 0.5 mL via INTRAMUSCULAR
  Filled 2016-04-15: qty 0.5

## 2016-04-15 NOTE — Progress Notes (Signed)
Trauma Service Note  Subjective: Pain better controlled, flatus today  Objective: Vital signs in last 24 hours: Temp:  [98.2 F (36.8 C)-99.8 F (37.7 C)] 98.4 F (36.9 C) (08/26 0800) Pulse Rate:  [115-127] 121 (08/26 0900) Resp:  [18-34] 21 (08/26 0900) BP: (97-123)/(58-89) 119/83 (08/26 0900) SpO2:  [90 %-100 %] 97 % (08/26 0900)    Intake/Output from previous day: 08/25 0701 - 08/26 0700 In: 2794.4 [I.V.:2544.4; IV Piggyback:250] Out: 2595 [Urine:1795; Emesis/NG output:780; Drains:20] Intake/Output this shift: Total I/O In: 100 [I.V.:100] Out: -   General: NAD  Lungs: CTAB  Abd: soft, NT, ND, vac in place without leak  Extremities: no edema  Neuro: AOx4  Lab Results: CBC   Recent Labs  04/14/16 0450 04/15/16 0203  WBC 7.6 7.3  HGB 9.8* 9.0*  HCT 32.0* 28.8*  PLT 272 252   BMET  Recent Labs  04/14/16 0450 04/15/16 [id number]  K 3.9 [address] [id number]   Luke Aaron Kinsinger Trauma Surgeon [phone]--office [facility] 04/15/2016
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
## 2016-04-15 NOTE — Progress Notes (Signed)
Rehab Admissions Coordinator Note:  Patient was screened by Clois DupesBoyette, Amazing Cowman Godwin for appropriateness for an Inpatient Acute Rehab Consult per PT recommendation.   At this time, we are recommending Inpatient Rehab consult. Please place order.  Clois DupesBoyette, Kohner Orlick Godwin 04/15/2016, 4:27 PM  I can be reached at [phone].

## 2016-04-16 ENCOUNTER — Inpatient Hospital Stay (HOSPITAL_COMMUNITY): Payer: Medicaid Other

## 2016-04-16 DIAGNOSIS — S36509A Unspecified injury of unspecified part of colon, initial encounter: Secondary | ICD-10-CM | POA: Diagnosis present

## 2016-04-16 DIAGNOSIS — D62 Acute posthemorrhagic anemia: Secondary | ICD-10-CM | POA: Diagnosis not present

## 2016-04-16 DIAGNOSIS — S36409A Unspecified injury of unspecified part of small intestine, initial encounter: Secondary | ICD-10-CM | POA: Diagnosis present

## 2016-04-16 MED ORDER — FUROSEMIDE 20 MG PO TABS
20.0000 mg | ORAL_TABLET | Freq: Once | ORAL | Status: AC
  Administered 2016-04-16: 20 mg via ORAL
  Filled 2016-04-16: qty 1

## 2016-04-16 MED ORDER — METOPROLOL TARTRATE 5 MG/5ML IV SOLN
5.0000 mg | Freq: Four times a day (QID) | INTRAVENOUS | Status: DC | PRN
  Filled 2016-04-16: qty 5

## 2016-04-16 NOTE — Clinical Social Work Note (Signed)
Clinical Social Work Assessment  Patient Details  Name: Kara Hunter MRN: [id number] Date of Birth: 11/29/1975  Date of referral:                  Reason for consult:                   Permission sought to share information with:    Permission granted to share information::     Name::        Agency::     Relationship::     Contact Information:     Housing/Transportation Living arrangements for the past 2 months:    Source of Information:    Patient Interpreter Needed:    Criminal Activity/Legal Involvement Pertinent to Current Situation/Hospitalization:    Significant Relationships:    Lives with:    Do you feel safe going back to the place where you live?    Need for family participation in patient care:     Care giving concerns: Pt expressed no care giving concerns at this time. Pt agreeable to CIR for higher level of care.    Social Worker assessment / plan: Holiday representative met with pt and discussed CSW role with discharge planning. CSW also explained PT recommendation for CIR. Pt agreeable to CIR for short- term rehab. CSW also spoke with pt concerning pt safety returning home from the hospital. Pt informed CSW that her family has moved into a new home and no longer lives in the same neighborhood. CSW also spoke with pt and pt daughter regarding plans for dtr while pt is in hospital. Pt daughter said they have family to look after her while her mother is in the hospital.   Employment status:    Insurance information:    PT Recommendations:    Information / Referral to community resources:     Patient/Family's Response to care:Pt pleasant and sitting in recliner with daughter at bedside when CSW entered into pt room. Pt and daughter appear happy with care pt is receiving at [facility]   Patient/Family's Understanding of and Emotional Response to Diagnosis, Current Treatment, and Prognosis:Pt appears to have a good understanding of reason for pt admission into the  hospital and of pt care plan.   Emotional Assessment Appearance:    Attitude/Demeanor/Rapport:    Affect (typically observed):    Orientation:    Alcohol / Substance use:    Psych involvement (Current and /or in the community):     Discharge Needs  Concerns to be addressed:    Readmission within the last 30 days:    Current discharge risk:    Barriers to Discharge:      Junie Spencer, LCSW 04/16/2016, 11:49 AM
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
## 2016-04-17 MED ORDER — FUROSEMIDE 20 MG PO TABS
20.0000 mg | ORAL_TABLET | Freq: Once | ORAL | Status: AC
Start: 2016-04-17 — End: 2016-04-17
  Administered 2016-04-17: 20 mg via ORAL
  Filled 2016-04-17: qty 1

## 2016-04-17 MED ORDER — OXYCODONE HCL 5 MG PO TABS
5.0000 mg | ORAL_TABLET | ORAL | Status: DC | PRN
  Administered 2016-04-17 – 2016-04-18 (×6): 10 mg via ORAL
  Filled 2016-04-17 (×6): qty 2

## 2016-04-17 MED ORDER — SODIUM CHLORIDE 0.9% FLUSH: 10.0000 mL | INTRAVENOUS | Status: DC | PRN

## 2016-04-17 MED ORDER — IPRATROPIUM-ALBUTEROL 0.5-2.5 (3) MG/3ML IN SOLN
3.0000 mL | RESPIRATORY_TRACT | Status: DC
  Administered 2016-04-17 – 2016-04-18 (×6): 3 mL via RESPIRATORY_TRACT
  Filled 2016-04-17 (×7): qty 3

## 2016-04-17 MED ORDER — HYDROMORPHONE HCL 1 MG/ML IJ SOLN: 0.5000 mg | INTRAMUSCULAR | Status: DC | PRN

## 2016-04-17 MED ORDER — WHITE PETROLATUM GEL
Status: AC
  Filled 2016-04-17: qty 1

## 2016-04-17 MED ORDER — DOCUSATE SODIUM 100 MG PO CAPS
100.0000 mg | ORAL_CAPSULE | Freq: Two times a day (BID) | ORAL | Status: DC
  Administered 2016-04-17 – 2016-04-18 (×2): 100 mg via ORAL
  Filled 2016-04-17 (×3): qty 1

## 2016-04-17 MED ORDER — POLYETHYLENE GLYCOL 3350 17 G PO PACK
17.0000 g | PACK | Freq: Every day | ORAL | Status: DC
  Administered 2016-04-17: 17 g via ORAL
  Filled 2016-04-17 (×2): qty 1

## 2016-04-17 MED ORDER — ENSURE ENLIVE PO LIQD
237.0000 mL | Freq: Three times a day (TID) | ORAL | Status: DC
  Administered 2016-04-18: 237 mL via ORAL
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
## 2016-04-17 NOTE — Anesthesia Postprocedure Evaluation (Signed)
Anesthesia Post Note  Patient: Kara Hunter  Procedure(s) Performed: Procedure(s) (LRB): EXPLORATORY LAPAROTOMY (N/A)  Patient location during evaluation: ICU Anesthesia Type: General Level of consciousness: patient remains intubated per anesthesia plan and sedated Pain management: pain level controlled Respiratory status: patient on ventilator - see flowsheet for VS and patient remains intubated per anesthesia plan Cardiovascular status: stable Anesthetic complications: no    Last Vitals:  Vitals:   04/17/16 0506 04/17/16 0510  BP: 125/70   Pulse: (!) 121 (!) 116  Resp: 18   Temp: 37.7 C     Last Pain:  Vitals:   04/17/16 0506  TempSrc: Oral  PainSc:                  Judea Riches S
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
## 2016-04-17 NOTE — Progress Notes (Signed)
Pt has progressed well and no longer in need of an inpt rehab admission. Home Health is recommended at this time. [phone]

## 2016-04-17 NOTE — Progress Notes (Signed)
Faxed completed and signed [facility].  Await insurance auth for home VAC.  Will follow up as updates are available.    Quintella BatonJulie W. Kriston Pasquarello, RN, BSN  Trauma/Neuro ICU Case Manager [phone]

## 2016-04-18 MED ORDER — FAMOTIDINE 20 MG PO TABS: 20.0000 mg | ORAL_TABLET | Freq: Two times a day (BID) | ORAL | Status: DC

## 2016-04-18 MED ORDER — OXYCODONE-ACETAMINOPHEN 5-325 MG PO TABS: 1.0000 | ORAL_TABLET | ORAL | 0 refills | Status: AC | PRN

## 2016-04-18 MED ORDER — IPRATROPIUM-ALBUTEROL 18-103 MCG/ACT IN AERO: 2.0000 | INHALATION_SPRAY | Freq: Four times a day (QID) | RESPIRATORY_TRACT | 0 refills | Status: AC | PRN

## 2016-04-18 NOTE — Discharge Instructions (Signed)
No lifting more than 5 pounds for 6 weeks.  No driving while taking oxycodone.

## 2016-04-18 NOTE — Progress Notes (Signed)
1620 Discharge instructions given, pt is on the home wound vac now. Home health [facility] to follow for home wound vac needs.
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
## 2016-04-18 NOTE — Progress Notes (Signed)
PT Cancellation Note  Patient Details Name: Kara Hunter XXXWomber MRN: [id number] DOB: 02-Jan-1976   Cancelled Treatment:    Reason Eval/Treat Not Completed: Patient declined, no reason specified (Pt eating lunch then leaving.  Pt reports she moved to a place where she has no stairs and feels comfortable moving around.  Pt reports walking around with occpational therapist earlier.  Pt reports no further question and remains focused on d/c home.  )   Tambria Pfannenstiel Artis DelayJ Braidon Chermak 04/18/2016, 1:39 PM  Joycelyn RuaAimee Pennye Beeghly, PTA pager [phone]
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
## 2016-04-18 NOTE — Progress Notes (Signed)
OT Cancellation Note  Patient Details Name: Kara Hunter MRN: [id number] DOB: [birth year]   Cancelled Treatment:    Reason Eval/Treat Not Completed: Fatigue/lethargy limiting ability to participate. Pt requested OT return later  Galen ManilaSpencer, Jacquelin Krajewski Jeanette 04/18/2016, 10:48 AM

## 2016-04-18 NOTE — Progress Notes (Signed)
Home Wound VAC has been approved and released for delivery; will arrive to patient's room within 4-5 hours, per [employer] representative.    Quintella BatonJulie W. Kyria Bumgardner, RN, BSN  Trauma/Neuro ICU Case Manager [phone]

## 2016-04-18 NOTE — Progress Notes (Signed)
Patient is for discharge with vac. Prior to discharge this writer was helping to change vac to home vac . The vac was showing a leak and this writer was trying to figure it out and patient was very upset and saying that we do not know what we are doing. This Clinical research associatewriter explained to her that we are trying to make it work and she is saying to call 1800, wound care called and while this writer was on the phone with the wound care, patient continue to murmur that we do not know what we are doing. This writer tried to put additional tape to seal for possible leak but patient refuse, instructed patient that if the machine will show leak at home to seal it with a tape. This Clinical research associatewriter was able to make the machine work and patient was eventually discharge by the primary nurse.

## 2016-04-19 ENCOUNTER — Encounter (HOSPITAL_BASED_OUTPATIENT_CLINIC_OR_DEPARTMENT_OTHER): Payer: Self-pay | Admitting: *Deleted
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
## 2016-04-27 ENCOUNTER — Telehealth (HOSPITAL_COMMUNITY): Payer: Self-pay

## 2016-04-27 NOTE — Telephone Encounter (Signed)
Gave rx for Perc 5/325, #20

## 2016-05-19 ENCOUNTER — Telehealth (HOSPITAL_COMMUNITY): Payer: Self-pay

## 2016-05-19 NOTE — Telephone Encounter (Signed)
Called in rx for tramadol 50mg ; #50 to pharmacy on file

## 2016-05-31 ENCOUNTER — Encounter (INDEPENDENT_AMBULATORY_CARE_PROVIDER_SITE_OTHER): Payer: Self-pay | Admitting: Physician Assistant

## 2018-06-14 IMAGING — CR DG CHEST 1V PORT
1 series · 1 of 1 positions shown · non-contrast
Comparison: Yesterday

CLINICAL DATA: Post intubation and central line placement.

EXAM:
PORTABLE CHEST 1 VIEW

[AP]
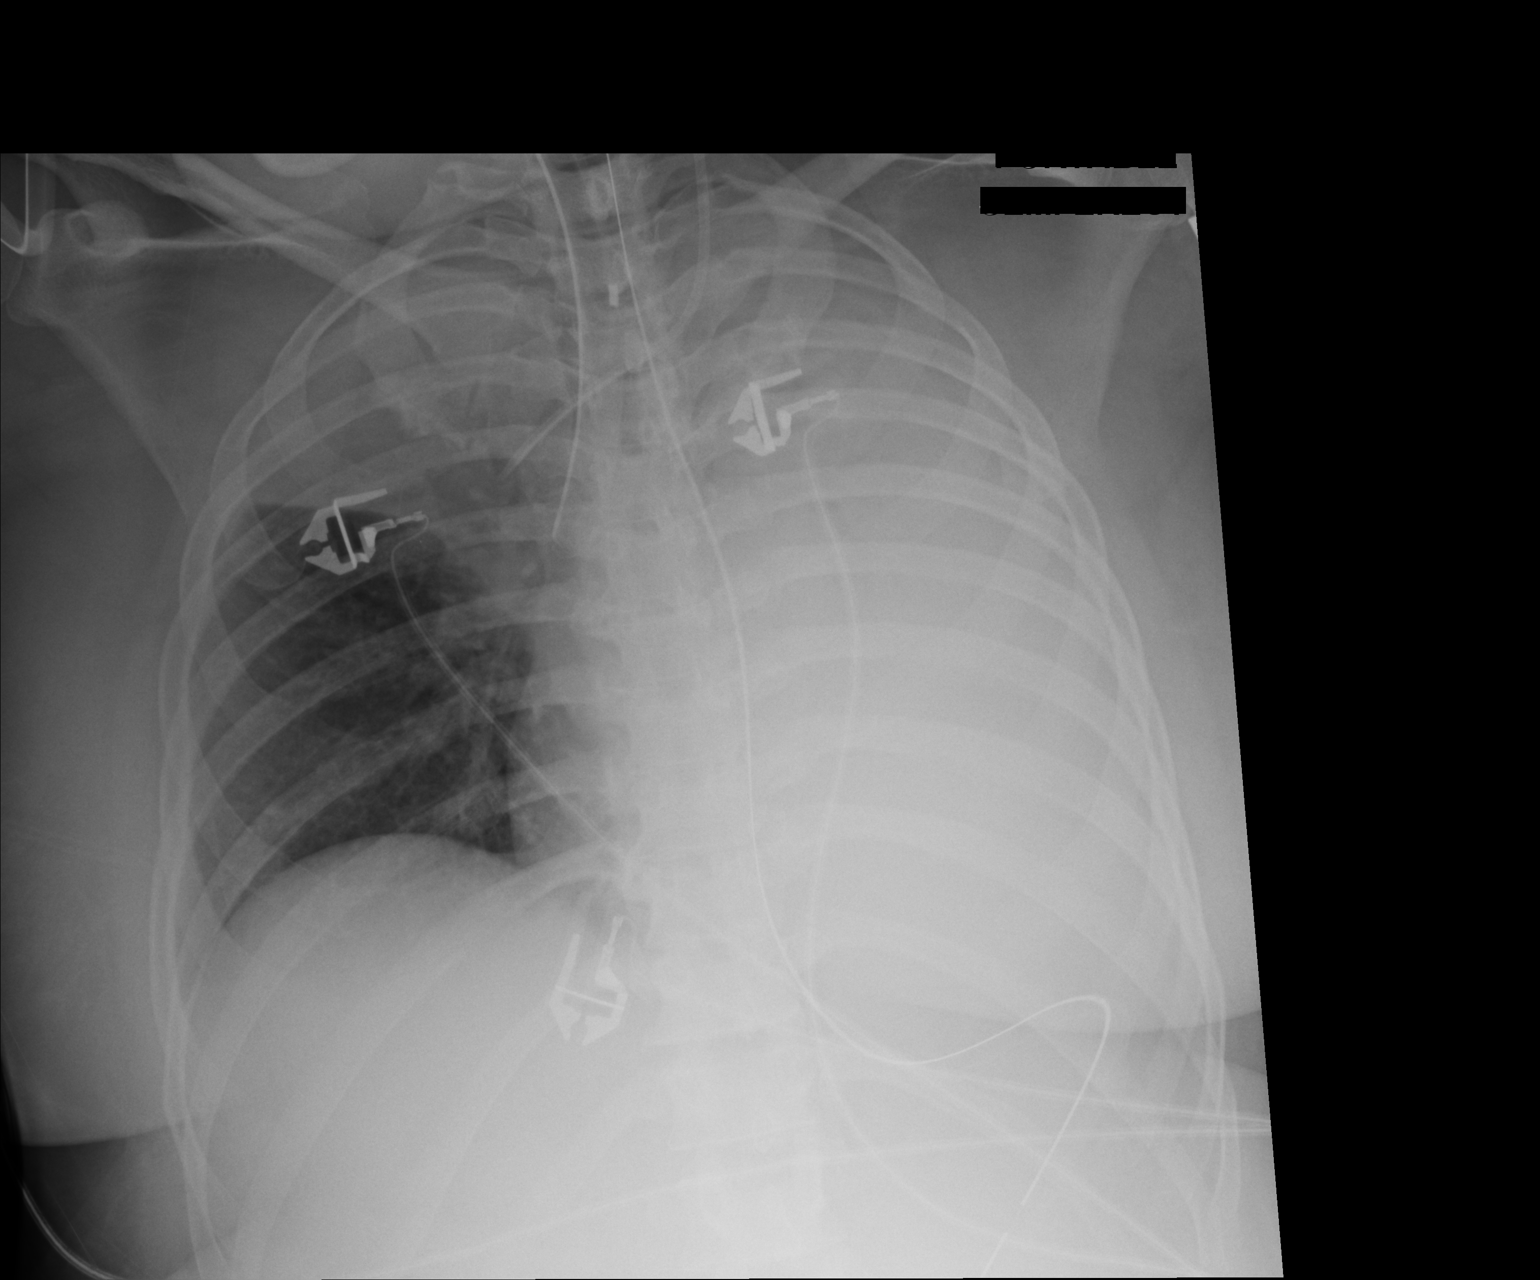

[1 of 1 positions shown; findings below may reference images not displayed]

FINDINGS: Deep intubation with white out of the left chest. This is already
been corrected at time of imaging. The right upper lobe is also
collapsed, likely due to bronchus intermedius intubation. Left IJ
central line with tip near the SVC origin. There is an orogastric
tube which at least reaches the stomach. Non obscured heart size is
unremarkable. No visible osseous trauma.
IMPRESSION: 1. Bronchus intermedius intubation with right upper lobe collapse
and left chest white out. This has already been corrected at time of
dictation.
2. Left IJ central line with tip near the SVC origin. No
pneumothorax.
3. Orogastric tube in good position.

## 2018-06-18 IMAGING — DX DG CHEST 2V
2 series · 2 of 2 positions shown · non-contrast
Comparison: 04/13/2016

CLINICAL DATA: Rhonchi .

EXAM:
CHEST  2 VIEW

[chest pa]
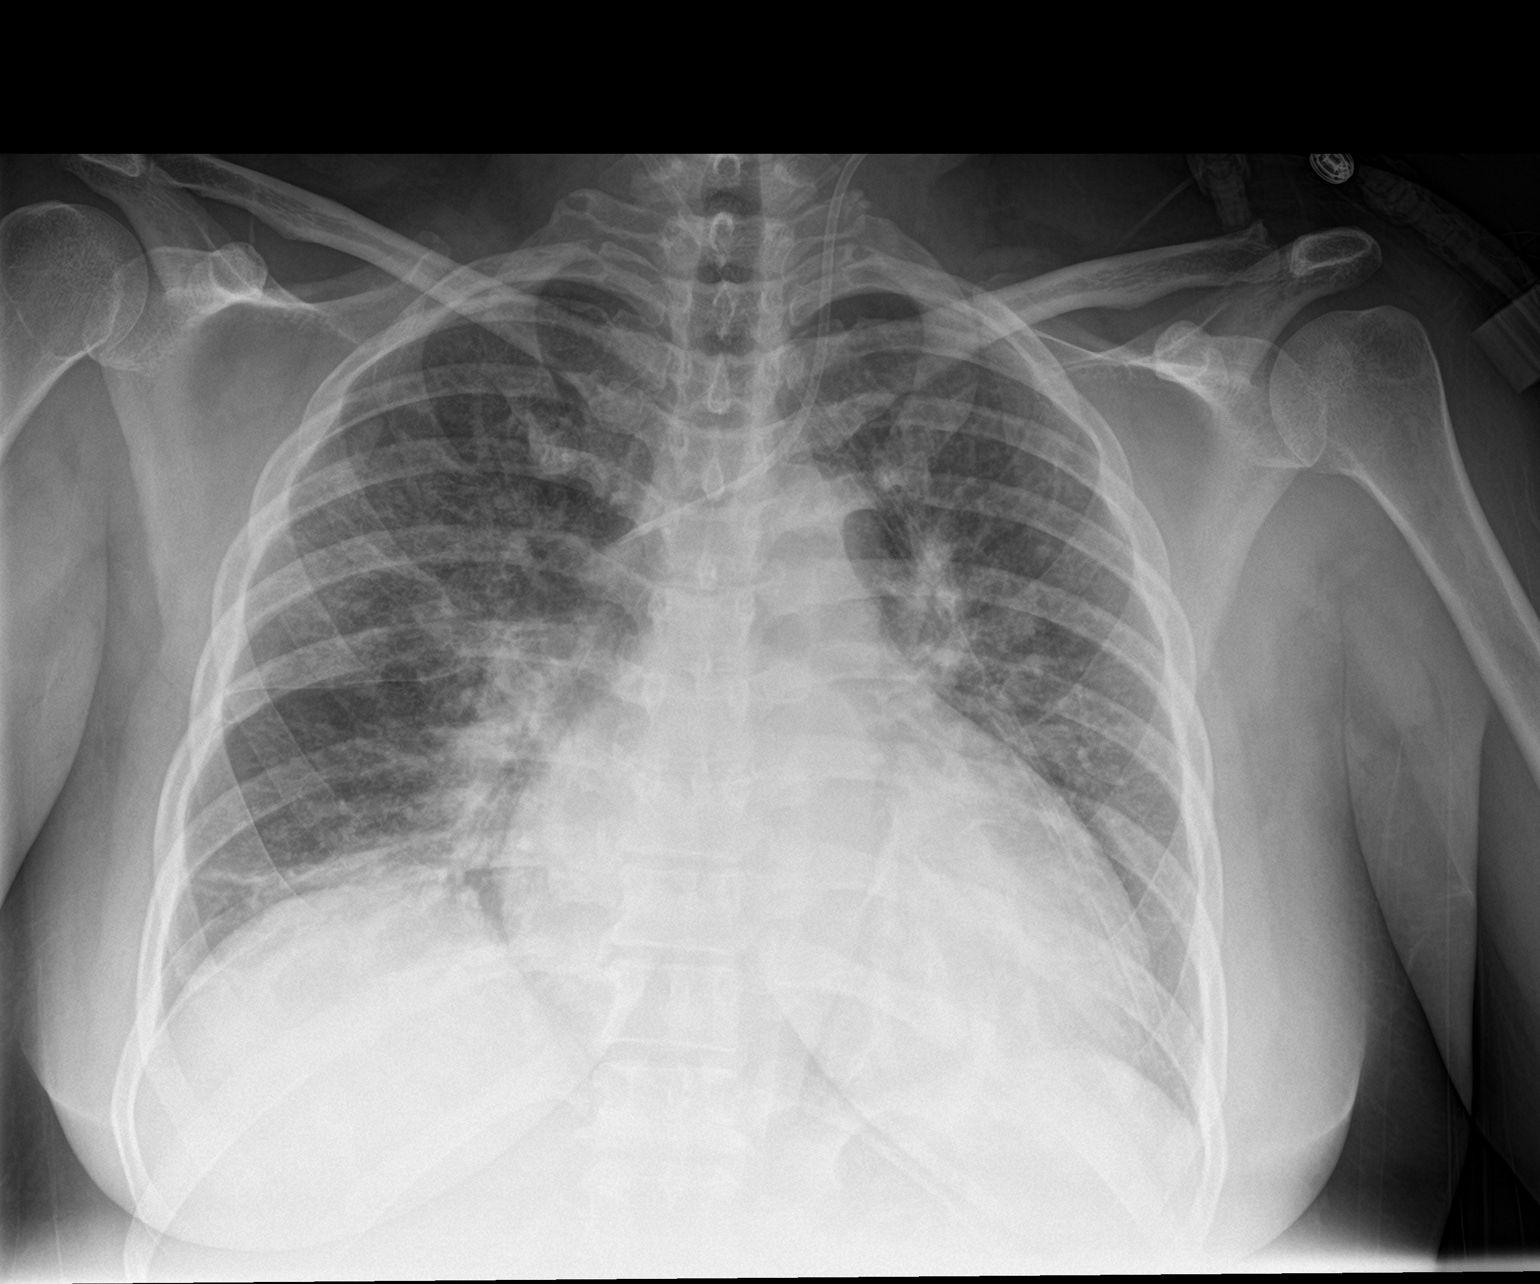

[chest lat]
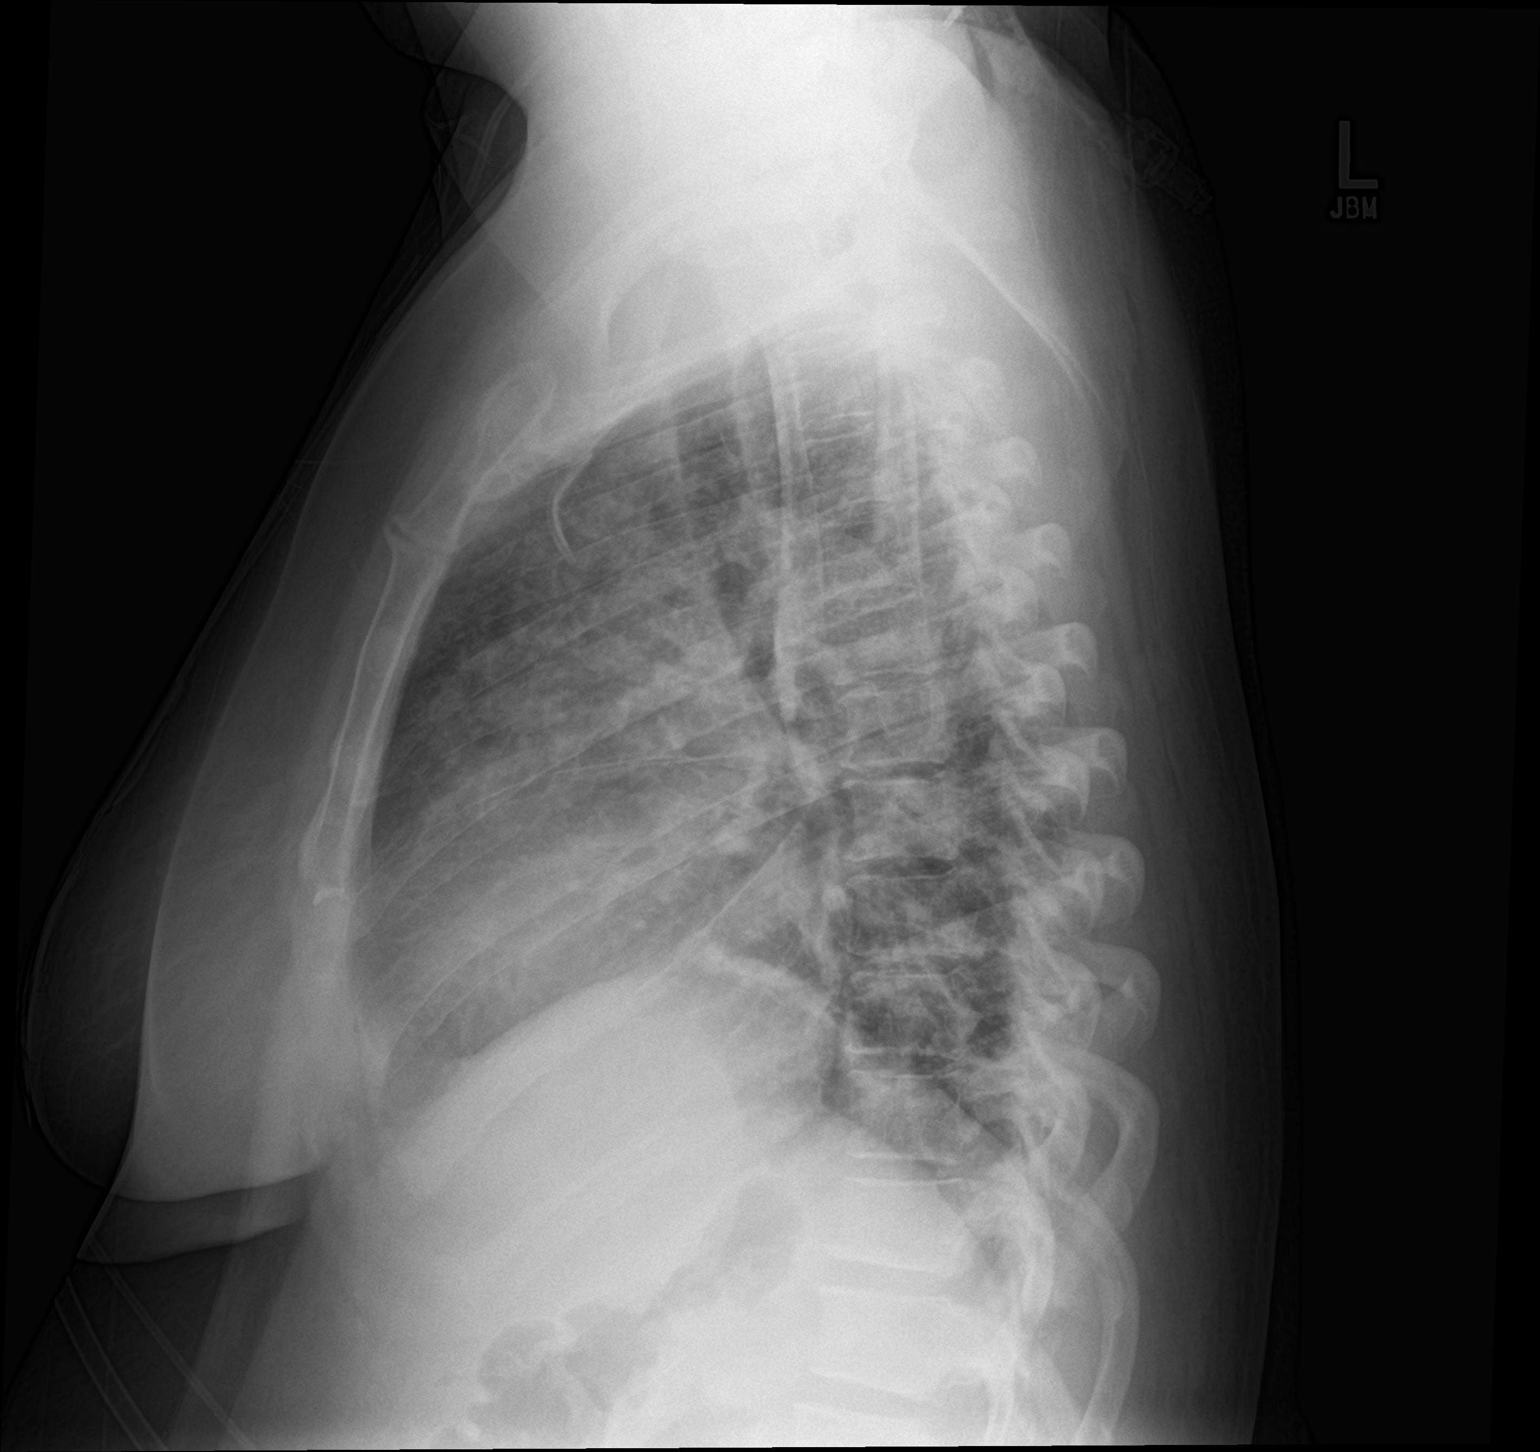

[2 of 2 positions shown; findings below may reference images not displayed]

FINDINGS: There is a left IJ catheter with tip in the projection of the SVC.
The heart size appears normal. There is a small amount of pleural
fluid with thickening of the fissures. Pulmonary vascular congestion
is present there is atelectasis in the lung bases.
IMPRESSION: 1. Mild fluid overload.
2. Bibasilar atelectasis.

## 2019-09-22 DEATH — deceased
